# Patient Record
Sex: Female | Born: 1990 | Race: White | Hispanic: No | Marital: Married | Smoking: Former smoker
Health system: Southern US, Community
[De-identification: ages and names within clinical notes are randomized; demographics above are authoritative.]

## PROBLEM LIST (undated history)

## (undated) DIAGNOSIS — Z803 Family history of malignant neoplasm of breast: Secondary | ICD-10-CM

## (undated) DIAGNOSIS — F32A Depression, unspecified: Secondary | ICD-10-CM

## (undated) DIAGNOSIS — F419 Anxiety disorder, unspecified: Secondary | ICD-10-CM

## (undated) HISTORY — PX: GALLBLADDER SURGERY: SHX652

## (undated) HISTORY — PX: CHOLECYSTECTOMY: SHX55

## (undated) HISTORY — DX: Depression, unspecified: F32.A

## (undated) HISTORY — DX: Family history of malignant neoplasm of breast: Z80.3

---
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
## 2011-02-06 ENCOUNTER — Observation Stay: Payer: Self-pay | Admitting: Advanced Practice Midwife

## 2011-05-26 ENCOUNTER — Observation Stay: Payer: Self-pay | Admitting: Obstetrics and Gynecology

## 2011-05-31 ENCOUNTER — Observation Stay: Payer: Self-pay

## 2011-06-12 ENCOUNTER — Inpatient Hospital Stay: Payer: Self-pay | Admitting: Obstetrics and Gynecology

## 2011-06-12 LAB — CBC WITH DIFFERENTIAL/PLATELET
Basophil %: 0.3 %
Eosinophil #: 0.1 10*3/uL (ref 0.0–0.7)
HCT: 32.6 % — ABNORMAL LOW (ref 35.0–47.0)
HGB: 9.9 g/dL — ABNORMAL LOW (ref 12.0–16.0)
Lymphocyte #: 1.6 10*3/uL (ref 1.0–3.6)
MCH: 27.3 pg (ref 26.0–34.0)
MCHC: 30.3 g/dL — ABNORMAL LOW (ref 32.0–36.0)
Monocyte #: 0.4 10*3/uL (ref 0.0–0.7)
Neutrophil %: 75.3 %
Platelet: 198 10*3/uL (ref 150–440)

## 2011-06-13 LAB — HEMATOCRIT: HCT: 27.7 % — ABNORMAL LOW (ref 35.0–47.0)

## 2011-07-21 ENCOUNTER — Ambulatory Visit: Payer: Self-pay

## 2011-07-21 LAB — RAPID STREP-A WITH REFLX: Micro Text Report: POSITIVE

## 2011-10-25 ENCOUNTER — Emergency Department: Payer: Self-pay | Admitting: Emergency Medicine

## 2014-12-03 ENCOUNTER — Encounter: Payer: Self-pay | Admitting: Medical Oncology

## 2014-12-03 ENCOUNTER — Emergency Department
Admission: EM | Admit: 2014-12-03 | Discharge: 2014-12-03 | Disposition: A | Discharge status: Home / Self Care | Payer: Self-pay | Attending: Emergency Medicine | Admitting: Emergency Medicine

## 2014-12-03 DIAGNOSIS — J029 Acute pharyngitis, unspecified: Secondary | ICD-10-CM | POA: Insufficient documentation

## 2014-12-03 DIAGNOSIS — Z880 Allergy status to penicillin: Secondary | ICD-10-CM | POA: Insufficient documentation

## 2014-12-03 DIAGNOSIS — Z720 Tobacco use: Secondary | ICD-10-CM | POA: Insufficient documentation

## 2014-12-03 MED ORDER — IBUPROFEN 800 MG PO TABS: 800.0000 mg | ORAL_TABLET | Freq: Three times a day (TID) | ORAL | Status: DC | PRN

## 2014-12-03 MED ORDER — LIDOCAINE VISCOUS 2 % MT SOLN: 20.0000 mL | OROMUCOSAL | Status: DC | PRN

## 2014-12-03 MED ORDER — AZITHROMYCIN 250 MG PO TABS: ORAL_TABLET | ORAL | Status: DC

## 2014-12-03 NOTE — ED Provider Notes (Signed)
[facility] Emergency Department Provider Note  ____________________________________________  Time seen: Approximately 10:44 AM  I have reviewed the triage vital signs and the nursing notes.   HISTORY  Chief Complaint Sore Throat    HPI Diane Myers is a 24 y.o. female who presents to the emergency room for sudden onset of fever this morning along with a sore throat. Patient reports that this is the fourth on this patient had positive strep. Is referred to ENT. Denies any change of appetite. No nausea no vomiting or diarrhea.   History reviewed. No pertinent past medical history.  There are no active problems to display for this patient.   Past Surgical History  Procedure Laterality Date  . Cholecystectomy      Current Outpatient Rx  Name  Route  Sig  Dispense  Refill  . azithromycin (ZITHROMAX Z-PAK) 250 MG tablet      Take 2 tablets (500 mg) on  Day 1,  followed by 1 tablet (250 mg) once daily on Days 2 through 5.   6 each   0   . ibuprofen (ADVIL,MOTRIN) 800 MG tablet   Oral   Take 1 tablet (800 mg total) by mouth every 8 (eight) hours as needed.   30 tablet   0   . lidocaine (XYLOCAINE) 2 % solution   Mouth/Throat   Use as directed 20 mLs in the mouth or throat as needed for mouth pain.   100 mL   0     Allergies Amoxicillin  No family history on file.  Social History History  Substance Use Topics  . Smoking status: Current Every Day Smoker -- 0.50 packs/day  . Smokeless tobacco: Not on file  . Alcohol Use: No    Review of Systems Constitutional: No fever/chills Eyes: No visual changes. ENT: Positive sore throat Cardiovascular: Denies chest pain. Respiratory: Denies shortness of breath. Gastrointestinal: No abdominal pain.  No nausea, no vomiting.  No diarrhea.  No constipation. Genitourinary: Negative for dysuria. Musculoskeletal: Negative for back pain. Skin: Negative for rash. Neurological: Negative for  headaches, focal weakness or numbness.  10-point ROS otherwise negative.  ____________________________________________   PHYSICAL EXAM:  VITAL SIGNS: ED Triage Vitals  Enc Vitals Group     BP 12/03/14 1000 125/78 mmHg     Pulse Rate 12/03/14 1000 94     Resp 12/03/14 1000 17     Temp 12/03/14 1000 98 F (36.7 C)     Temp Source 12/03/14 1000 Oral     SpO2 12/03/14 1000 100 %     Weight 12/03/14 1000 230 lb (104.327 kg)     Height 12/03/14 1000  (1.6 m)     Head Cir --      Peak Flow --      Pain Score 12/03/14 1000 2     Pain Loc --      Pain Edu? --      Excl. in GC? --     Constitutional: Alert and oriented. Well appearing and in no acute distress. Eyes: Conjunctivae are normal. PERRL. EOMI. Head: Atraumatic. Nose: No congestion/rhinnorhea. Mouth/Throat: Mucous membranes are moist.  Oropharynx erythematous with tonsillar exudate noted. Neck: No stridor.  No lymphadenopathy Cardiovascular: Normal rate, regular rhythm. Grossly normal heart sounds.  Good peripheral circulation. Respiratory: Normal respiratory effort.  No retractions. Lungs CTAB. Gastrointestinal: Soft and nontender. No distention. No abdominal bruits. No CVA tenderness. Musculoskeletal: No lower extremity tenderness nor edema.  No joint effusions. Neurologic:  Normal speech and language. No gross focal neurologic deficits are appreciated. Speech is normal. No gait instability. Skin:  Skin is warm, dry and intact. No rash noted. Psychiatric: Mood and affect are normal. Speech and behavior are normal.  ____________________________________________   LABS (all labs ordered are listed, but only abnormal results are displayed)  Labs Reviewed  CULTURE, GROUP A STREP ([facility] ONLY)   ____________________________________________   PROCEDURES  Procedure(s) performed: None  Critical Care performed: No  ____________________________________________   INITIAL IMPRESSION / ASSESSMENT AND PLAN / ED  COURSE  Pertinent labs & imaging results that were available during my care of the patient were reviewed by me and considered in my medical decision making (see chart for details).  Acute pharyngitis. Rapid strep negative throat culture pending. Will prophylactically treat with Zithromax since patient has a penicillin allergy. Patient cursed follow up with ENT as directed.  Patient voices no other emergency medical complaints at this visit. ____________________________________________   FINAL CLINICAL IMPRESSION(S) / ED DIAGNOSES  Final diagnoses:  Acute pharyngitis, unspecified pharyngitis type      Evangeline Dakin, PA-C 12/03/14 1049  Jene Every, MD 12/03/14 [phone]

## 2014-12-03 NOTE — ED Notes (Signed)
Pt ambulatory to triage with reports of sore throat that began this am.

## 2014-12-03 NOTE — Discharge Instructions (Signed)

## 2014-12-05 LAB — CULTURE, GROUP A STREP (THRC)

## 2014-12-07 LAB — POCT RAPID STREP A: STREPTOCOCCUS, GROUP A SCREEN (DIRECT): POSITIVE — AB

## 2016-11-10 ENCOUNTER — Emergency Department
Admission: EM | Admit: 2016-11-10 | Discharge: 2016-11-10 | Disposition: A | Discharge status: Home / Self Care | Payer: Self-pay | Attending: Emergency Medicine | Admitting: Emergency Medicine

## 2016-11-10 ENCOUNTER — Encounter: Payer: Self-pay | Admitting: Medical Oncology

## 2016-11-10 DIAGNOSIS — K0889 Other specified disorders of teeth and supporting structures: Secondary | ICD-10-CM | POA: Insufficient documentation

## 2016-11-10 DIAGNOSIS — Z791 Long term (current) use of non-steroidal anti-inflammatories (NSAID): Secondary | ICD-10-CM | POA: Insufficient documentation

## 2016-11-10 DIAGNOSIS — Z79899 Other long term (current) drug therapy: Secondary | ICD-10-CM | POA: Insufficient documentation

## 2016-11-10 DIAGNOSIS — Z9049 Acquired absence of other specified parts of digestive tract: Secondary | ICD-10-CM | POA: Insufficient documentation

## 2016-11-10 DIAGNOSIS — F17200 Nicotine dependence, unspecified, uncomplicated: Secondary | ICD-10-CM | POA: Insufficient documentation

## 2016-11-10 MED ORDER — TRAMADOL HCL 50 MG PO TABS: 50.0000 mg | ORAL_TABLET | Freq: Two times a day (BID) | ORAL | 0 refills | Status: DC | PRN

## 2016-11-10 MED ORDER — IBUPROFEN 600 MG PO TABS
600.0000 mg | ORAL_TABLET | Freq: Once | ORAL | Status: AC
  Administered 2016-11-10: 600 mg via ORAL
  Filled 2016-11-10: qty 1

## 2016-11-10 MED ORDER — ERYTHROMYCIN BASE 500 MG PO TABS: 500.0000 mg | ORAL_TABLET | Freq: Four times a day (QID) | ORAL | 0 refills | Status: AC

## 2016-11-10 MED ORDER — IBUPROFEN 600 MG PO TABS: 600.0000 mg | ORAL_TABLET | Freq: Three times a day (TID) | ORAL | 0 refills | Status: DC | PRN

## 2016-11-10 MED ORDER — TRAMADOL HCL 50 MG PO TABS
50.0000 mg | ORAL_TABLET | Freq: Once | ORAL | Status: AC
  Administered 2016-11-10: 50 mg via ORAL
  Filled 2016-11-10: qty 1

## 2016-11-10 NOTE — ED Notes (Signed)
Pt verbalized understanding of discharge instructions. NAD at this time. 

## 2016-11-10 NOTE — ED Triage Notes (Signed)
Left sided dental pain with swelling x 2 days.

## 2016-11-10 NOTE — Discharge Instructions (Signed)
May follow up with list of dental clinics provided: OPTIONS FOR DENTAL FOLLOW UP CARE  [place] Department of Health and Human Services - Local Safety Net Dental Clinics TripDoors.comhttp://www.ncdhhs.gov/dph/oralhealth/services/safetynetclinics.htm   [facility] [phone])  [facility] [place] [phone])  [facility] [place] [phone]  [facility] [phone])  [facility] [phone]) This clinic caters to the indigent population and is on a lottery system. Location: [employer], [address], [address], [place] Clinic Hours: Wednesdays from 6pm - 9pm, patients seen by a lottery system. For dates, call or go to [email or web address] Services: Cleanings, fillings and simple extractions. Payment Options: DENTAL WORK IS FREE OF CHARGE. Bring proof of income or support. Best way to get seen: Arrive at 5:15 pm - this is a lottery, NOT first come/first serve, so arriving earlier will not increase your chances of being seen.     [facility] [phone] Select option 1 for emergencies   Location: [facility], [place], [address], [place] [facility] Hours: No walk-ins accepted - call the day before to schedule an appointment. Check in times are 9:30 am and 1:30 pm. Services: Simple extractions, temporary fillings, pulpectomy/pulp debridement, uncomplicated abscess drainage. Payment Options: PAYMENT IS DUE AT THE TIME OF SERVICE.  Fee is usually $100-200, additional surgical procedures (e.g. abscess drainage) may be extra. Cash, checks, Visa/MasterCard accepted.  Can file Medicaid if patient is covered for dental - patient should call case worker to check. No discount for [facility] patients. Best way to get seen: MUST call the day before and get onto the schedule. Can usually be seen the next 1-2 days. No walk-ins accepted.      [facility] [phone]   Location: [facility], [address], [place] Clinic Hours: M, W, Th, F 8am or 1:30pm, Tues 9a or 1:30 - first come/first served. Services: Simple extractions, temporary fillings, uncomplicated abscess drainage.  You do not need to be an [facility] [place] [place] resident. Payment Options: PAYMENT IS DUE AT THE TIME OF SERVICE. Dental insurance, otherwise sliding scale - bring proof of income or support. Depending on income and treatment needed, cost is usually $50-200. Best way to get seen: Arrive early as it is first come/first served.     [facility] [phone]   Location: [address] Clinic Hours: Mon-Thu 8a-5p Services: Most basic dental services including extractions and fillings. Payment Options: PAYMENT IS DUE AT THE TIME OF SERVICE. Sliding scale, up to 50% off - bring proof if income or support. Medicaid with dental option accepted. Best way to get seen: Call to schedule an appointment, can usually be seen within 2 weeks OR they will try to see walk-ins - show up at 8a or 2p (you may have to wait).     [facility] [phone] [place] RESIDENTS ONLY   Location: [facility], [address], [place], [place] [phone] Clinic Hours: By appointment only. Monday - Thursday 8am-5pm, Friday 8am-12pm Services: Cleanings, fillings, extractions. Payment Options: PAYMENT IS DUE AT THE TIME OF SERVICE. Cash, Visa or MasterCard. Sliding scale - $30 minimum per service. Best way to get seen: Come in to office, complete packet and make an appointment - need proof of income or support monies for each household member and proof of [facility] [place] residence. Usually takes about a month to get in.     [facility] [phone]   Location: [address], [place] Emergency Room Hours: Walk-in Urgent Care  Dental Services are offered Monday-Friday mornings only. The numbers of emergencies accepted daily is limited to the number of providers available. Maximum 15 - Mondays, Wednesdays & Thursdays Maximum 10 - Tuesdays & Fridays Services: You do not need to be a [facility] resident to be seen for a dental emergency. Emergencies are defined as pain, swelling, abnormal bleeding, or dental trauma. Walkins will receive x-rays if needed. NOTE: Dental cleaning is not an emergency. Payment Options: PAYMENT IS DUE AT THE TIME OF SERVICE. Minimum co-pay is $40.00 for uninsured patients. Minimum co-pay is $3.00 for Medicaid with dental coverage. Dental Insurance is accepted and must be presented at time of visit. Medicare does not cover dental. Forms of payment: Cash, credit card, checks. Best way to get seen: If not previously registered with the clinic, walk-in dental registration begins at 7:15 am and is on a first come/first serve basis. If previously registered with the clinic, call to make an appointment.     [facility] [place] ONLY   Location: [address], [place], [place] Clinic Hours: Mon-Thu 10a-2p Services: Extractions only! Payment Options: FREE (donations accepted) - bring proof of income or support Best way to get seen: Call and schedule an appointment OR come at 8am on the 1st Monday of every month (except for holidays) when it is first come/first served.     [facility] [phone]   Location: [place], [place] Clinic Hours: Friday mornings Services, Payment Options, Best way to get seen: Call for info

## 2016-11-10 NOTE — ED Provider Notes (Signed)
[facility] Emergency Department Provider Note   ____________________________________________   First MD Initiated Contact with Patient 11/10/16 1358     (approximate)  I have reviewed the triage vital signs and the nursing notes.   HISTORY  Chief Complaint Dental Pain    HPI Diane Myers is a 26 y.o. female patient complains of dental pain for 2 days. Patient states she has a cavity is filled with some over-the-counter filler which has not relieved her pain.patient rates the pain as a 5/10. Patient described a pain as "achy, stabbing'.   History reviewed. No pertinent past medical history.  There are no active problems to display for this patient.   Past Surgical History:  Procedure Laterality Date  . CHOLECYSTECTOMY      Prior to Admission medications   Medication Sig Start Date End Date Taking? Authorizing Provider  azithromycin (ZITHROMAX Z-PAK) 250 MG tablet Take 2 tablets (500 mg) on  Day 1,  followed by 1 tablet (250 mg) once daily on Days 2 through 5. 12/03/14   Beers, Charmayne Sheer, PA-C  erythromycin base (E-MYCIN) 500 MG tablet Take 1 tablet (500 mg total) by mouth 4 (four) times daily. 11/10/16 11/20/16  Joni Reining, PA-C  ibuprofen (ADVIL,MOTRIN) 600 MG tablet Take 1 tablet (600 mg total) by mouth every 8 (eight) hours as needed. 11/10/16   Joni Reining, PA-C  ibuprofen (ADVIL,MOTRIN) 800 MG tablet Take 1 tablet (800 mg total) by mouth every 8 (eight) hours as needed. 12/03/14   Beers, Charmayne Sheer, PA-C  lidocaine (XYLOCAINE) 2 % solution Use as directed 20 mLs in the mouth or throat as needed for mouth pain. 12/03/14   Beers, Charmayne Sheer, PA-C  traMADol (ULTRAM) 50 MG tablet Take 1 tablet (50 mg total) by mouth every 12 (twelve) hours as needed. 11/10/16   Joni Reining, PA-C    Allergies Amoxicillin  No family history on file.  Social History Social History  Substance Use Topics  . Smoking status: Current Every Day Smoker   Packs/day: 0.50  . Smokeless tobacco: Not on file  . Alcohol use No    Review of Systems  Constitutional: No fever/chills Eyes: No visual changes. ENT: No sore throat. Dental pain Cardiovascular: Denies chest pain. Respiratory: Denies shortness of breath. Gastrointestinal: No abdominal pain.  No nausea, no vomiting.  No diarrhea.  No constipation. Genitourinary: Negative for dysuria. Musculoskeletal: Negative for back pain. Skin: Negative for rash. Neurological: Negative for headaches, focal weakness or numbness. Allergic/Immunilogical: amoxicillin  ____________________________________________   PHYSICAL EXAM:  VITAL SIGNS: ED Triage Vitals  Enc Vitals Group     BP 11/10/16 1330 (!) 141/80     Pulse Rate 11/10/16 1328 94     Resp 11/10/16 1328 18     Temp 11/10/16 1328 98.6 F (37 C)     Temp Source 11/10/16 1328 Oral     SpO2 11/10/16 1328 99 %     Weight 11/10/16 1328 230 lb (104.3 kg)     Height --      Head Circumference --      Peak Flow --      Pain Score 11/10/16 1328 5     Pain Loc --      Pain Edu? --      Excl. in GC? --     Constitutional: Alert and oriented. Well appearing and in no acute distress. Mouth/Throat: Mucous membranes are moist.  Oropharynx non-erythematous. Edematous gingiva and cavity at tooth  number 19. Hematological/Lymphatic/Immunilogical: No cervical lymphadenopathy. Cardiovascular: Normal rate, regular rhythm. Grossly normal heart sounds.  Good peripheral circulation. Respiratory: Normal respiratory effort.  No retractions. Lungs CTAB. Neurologic:  Normal speech and language. No gross focal neurologic deficits are appreciated. No gait instability. Skin:  Skin is warm, dry and intact. No rash noted. Psychiatric: Mood and affect are normal. Speech and behavior are normal.  ____________________________________________   LABS (all labs ordered are listed, but only abnormal results are displayed)  Labs Reviewed - No data to  display ____________________________________________  EKG   ____________________________________________  RADIOLOGY   ____________________________________________   PROCEDURES  Procedure(s) performed: None  Procedures  Critical Care performed: No  ____________________________________________   INITIAL IMPRESSION / ASSESSMENT AND PLAN / ED COURSE  Pertinent labs & imaging results that were available during my care of the patient were reviewed by me and considered in my medical decision making (see chart for details).  Dental pain secondary to cavity. Patient given discharge care instructions. Patient advised to follow-up with the walk-in dental clinic on Monday morning.      ____________________________________________   FINAL CLINICAL IMPRESSION(S) / ED DIAGNOSES  Final diagnoses:  Pain, dental      NEW MEDICATIONS STARTED DURING THIS VISIT:  New Prescriptions   ERYTHROMYCIN BASE (E-MYCIN) 500 MG TABLET    Take 1 tablet (500 mg total) by mouth 4 (four) times daily.   IBUPROFEN (ADVIL,MOTRIN) 600 MG TABLET    Take 1 tablet (600 mg total) by mouth every 8 (eight) hours as needed.   TRAMADOL (ULTRAM) 50 MG TABLET    Take 1 tablet (50 mg total) by mouth every 12 (twelve) hours as needed.     Note:  This document was prepared using Dragon voice recognition software and may include unintentional dictation errors.    Joni ReiningSmith, Anjoli Diemer K, PA-C 11/10/16 1411    Jene EveryKinner, Robert, MD 11/10/16 [phone]

## 2016-11-15 ENCOUNTER — Encounter: Payer: Self-pay | Admitting: *Deleted

## 2016-11-15 ENCOUNTER — Emergency Department
Admission: EM | Admit: 2016-11-15 | Discharge: 2016-11-15 | Disposition: A | Discharge status: Home / Self Care | Payer: Self-pay | Attending: Emergency Medicine | Admitting: Emergency Medicine

## 2016-11-15 DIAGNOSIS — Z9049 Acquired absence of other specified parts of digestive tract: Secondary | ICD-10-CM | POA: Insufficient documentation

## 2016-11-15 DIAGNOSIS — Z791 Long term (current) use of non-steroidal anti-inflammatories (NSAID): Secondary | ICD-10-CM | POA: Insufficient documentation

## 2016-11-15 DIAGNOSIS — R21 Rash and other nonspecific skin eruption: Secondary | ICD-10-CM | POA: Insufficient documentation

## 2016-11-15 DIAGNOSIS — F17200 Nicotine dependence, unspecified, uncomplicated: Secondary | ICD-10-CM | POA: Insufficient documentation

## 2016-11-15 DIAGNOSIS — Z79899 Other long term (current) drug therapy: Secondary | ICD-10-CM | POA: Insufficient documentation

## 2016-11-15 DIAGNOSIS — T368X5A Adverse effect of other systemic antibiotics, initial encounter: Secondary | ICD-10-CM | POA: Insufficient documentation

## 2016-11-15 DIAGNOSIS — T7840XA Allergy, unspecified, initial encounter: Secondary | ICD-10-CM

## 2016-11-15 NOTE — ED Provider Notes (Signed)
[facility]-EMERGENCY DEPARTMENT Provider Note   CSN: [id number] Arrival date & time: 11/15/16  1544     History   Chief Complaint No chief complaint on file.   HPI Diane Myers is a 26 y.o. female Presents to the emergency department for evaluation of skin rash. Patient was seen in the emergency department Saturday 5 days ago, started on clindamycin for dental pain. Pain has resolved. States she works outside has been out of his son has developed a rash along her head, neck and back as well as her feet. She denies any chest pain, shortness of breath, skin peeling, conjunctivitis symptoms, coughing, wheezing, Itching.   HPI  History reviewed. No pertinent past medical history.  There are no active problems to display for this patient.   Past Surgical History:  Procedure Laterality Date  . CHOLECYSTECTOMY      OB History    No data available       Home Medications    Prior to Admission medications   Medication Sig Start Date End Date Taking? Authorizing Provider  azithromycin (ZITHROMAX Z-PAK) 250 MG tablet Take 2 tablets (500 mg) on  Day 1,  followed by 1 tablet (250 mg) once daily on Days 2 through 5. 12/03/14   Beers, Charmayne Sheer, PA-C  erythromycin base (E-MYCIN) 500 MG tablet Take 1 tablet (500 mg total) by mouth 4 (four) times daily. 11/10/16 11/20/16  Joni Reining, PA-C  ibuprofen (ADVIL,MOTRIN) 600 MG tablet Take 1 tablet (600 mg total) by mouth every 8 (eight) hours as needed. 11/10/16   Joni Reining, PA-C  ibuprofen (ADVIL,MOTRIN) 800 MG tablet Take 1 tablet (800 mg total) by mouth every 8 (eight) hours as needed. 12/03/14   Beers, Charmayne Sheer, PA-C  lidocaine (XYLOCAINE) 2 % solution Use as directed 20 mLs in the mouth or throat as needed for mouth pain. 12/03/14   Beers, Charmayne Sheer, PA-C  traMADol (ULTRAM) 50 MG tablet Take 1 tablet (50 mg total) by mouth every 12 (twelve) hours as needed. 11/10/16   Joni Reining, PA-C    Family History History reviewed. No pertinent  family history.  Social History Social History  Substance Use Topics  . Smoking status: Current Every Day Smoker    Packs/day: 0.50  . Smokeless tobacco: Never Used  . Alcohol use No     Allergies   Amoxicillin   Review of Systems Review of Systems  Constitutional: Negative for activity change, chills, fatigue and fever.  HENT: Negative for congestion, sinus pressure, sore throat, trouble swallowing and voice change.   Eyes: Negative for visual disturbance.  Respiratory: Negative for cough, chest tightness, shortness of breath, wheezing and stridor.   Cardiovascular: Negative for chest pain and leg swelling.  Gastrointestinal: Negative for abdominal pain, diarrhea, nausea and vomiting.  Genitourinary: Negative for dysuria.  Musculoskeletal: Negative for arthralgias and gait problem.  Skin: Positive for rash.  Neurological: Negative for weakness, numbness and headaches.  Hematological: Negative for adenopathy.  Psychiatric/Behavioral: Negative for agitation, behavioral problems and confusion.     Physical Exam Updated Vital Signs BP 140/84 (BP Location: Left Arm)   Pulse 100   Temp 98.4 F (36.9 C) (Oral)   Resp 16   Ht 5\' 4"  (1.626 m)   Wt 99.8 kg (220 lb)   SpO2 99%   BMI 37.76 kg/m   Physical Exam  Constitutional: She is oriented to person, place, and time. She appears well-developed and well-nourished. No distress.  HENT:  Head: Normocephalic  and atraumatic.  Right Ear: External ear normal.  Left Ear: External ear normal.  Nose: Nose normal.  Mouth/Throat: Oropharynx is clear and moist. No oropharyngeal exudate.  No signs of angioedema.  Eyes: EOM are normal. Pupils are equal, round, and reactive to light. Right eye exhibits no discharge. Left eye exhibits no discharge.  Neck: Normal range of motion. Neck supple.  Cardiovascular: Normal rate, regular rhythm and intact distal pulses.   Pulmonary/Chest: Effort normal and breath sounds normal. No respiratory  distress. She exhibits no tenderness.  Abdominal: Soft. She exhibits no distension. There is no tenderness.  Musculoskeletal: Normal range of motion. She exhibits no edema.  Neurological: She is alert and oriented to person, place, and time. She has normal reflexes.  Skin: Skin is warm and dry. Rash (macular lacelike rash along the back, neck, feet. No evidence of blistering or skin peeling.) noted.  Skin is nontender.  Psychiatric: She has a normal mood and affect. Her behavior is normal. Thought content normal.     ED Treatments / Results  Labs (all labs ordered are listed, but only abnormal results are displayed) Labs Reviewed - No data to display  EKG  EKG Interpretation None       Radiology No results found.  Procedures Procedures (including critical care time)  Medications Ordered in ED Medications - No data to display   Initial Impression / Assessment and Plan / ED Course  I have reviewed the triage vital signs and the nursing notes.  Pertinent labs & imaging results that were available during my care of the patient were reviewed by me and considered in my medical decision making (see chart for details).     26 year old female with allergic reaction to clindamycin. She has been out in the sun, could be some photosensitivity. Recommend she discontinue the medication. No sign of blistering, skin peeling, conjunctivitis, shortness of breath. Vital signs are stable. She will discontinue the medication for her dental pain, dental pain has resolved, she will follow-up with dentist. She is educated on signs and symptoms return to clinic for.  Final Clinical Impressions(s) / ED Diagnoses   Final diagnoses:  Allergic reaction to drug, initial encounter  Skin rash    New Prescriptions New Prescriptions   No medications on file     Ronnette JuniperGaines, Thomas C, PA-C 11/15/16 1629    Merrily Brittleifenbark, Neil, MD 11/15/16 [phone]

## 2016-11-15 NOTE — ED Triage Notes (Signed)
Pt to ED reporting a rash after starting clindamycin on Tuesday. Pt was placed on antibiotic by ED for tooth infection. Tooth has cleared up per pt but pt now has rash covering chest and arms. Rash consists of small red raised bumps that pt reports feel as though they are burning and are itching at this time. PT reports having taken benadryl for the first time today and denies relief. No SOB or complications with airway noted.

## 2016-11-15 NOTE — ED Notes (Addendum)
See triage note  Was seen about 5  days ago for dental pain and placed on e-mycin  Developed a rash  No resp distress noted

## 2016-11-15 NOTE — Discharge Instructions (Signed)
Please discontinue clindamycin and stay out of the sun. Please follow-up with dental clinic as soon as possible. Return to the ER for any worsening symptoms urgent changes in her health. Would recommend skin testing to confirm allergy to clindamycin.

## 2018-05-18 ENCOUNTER — Other Ambulatory Visit: Payer: Self-pay

## 2018-05-18 ENCOUNTER — Encounter: Payer: Self-pay | Admitting: Emergency Medicine

## 2018-05-18 ENCOUNTER — Emergency Department
Admission: EM | Admit: 2018-05-18 | Discharge: 2018-05-18 | Disposition: A | Discharge status: Home / Self Care | Payer: Self-pay | Attending: Emergency Medicine | Admitting: Emergency Medicine

## 2018-05-18 ENCOUNTER — Emergency Department: Payer: Self-pay

## 2018-05-18 DIAGNOSIS — F17210 Nicotine dependence, cigarettes, uncomplicated: Secondary | ICD-10-CM | POA: Insufficient documentation

## 2018-05-18 DIAGNOSIS — J209 Acute bronchitis, unspecified: Secondary | ICD-10-CM | POA: Insufficient documentation

## 2018-05-18 MED ORDER — ALBUTEROL SULFATE HFA 108 (90 BASE) MCG/ACT IN AERS: 2.0000 | INHALATION_SPRAY | RESPIRATORY_TRACT | 1 refills | Status: DC | PRN

## 2018-05-18 MED ORDER — PREDNISONE 10 MG PO TABS: 50.0000 mg | ORAL_TABLET | Freq: Every day | ORAL | 0 refills | Status: DC

## 2018-05-18 MED ORDER — PREDNISONE 20 MG PO TABS
60.0000 mg | ORAL_TABLET | Freq: Once | ORAL | Status: AC
  Administered 2018-05-18: 60 mg via ORAL
  Filled 2018-05-18: qty 3

## 2018-05-18 MED ORDER — AZITHROMYCIN 250 MG PO TABS: ORAL_TABLET | ORAL | 0 refills | Status: DC

## 2018-05-18 MED ORDER — IPRATROPIUM-ALBUTEROL 0.5-2.5 (3) MG/3ML IN SOLN
3.0000 mL | Freq: Once | RESPIRATORY_TRACT | Status: AC
  Administered 2018-05-18: 3 mL via RESPIRATORY_TRACT
  Filled 2018-05-18: qty 3

## 2018-05-18 NOTE — Discharge Instructions (Signed)
Follow up with the primary care provider of your choice for symptoms that are not improving over the next few days.  Return to the ER for symptoms that change or worsen if unable to schedule an appointment. 

## 2018-05-18 NOTE — ED Provider Notes (Signed)
[facility] Emergency Department Provider Note  ____________________________________________  Time seen: Approximately 10:55 AM  I have reviewed the triage vital signs and the nursing notes.   HISTORY  Chief Complaint Cough   HPI Diane Myers is a 27 y.o. female who presents to the emergency department for treatment and evaluation of cough for the past several weeks.  It is occasionally productive. She has been taking Mucinex with some relief. Fever last week. She smokes about 1/2 pack per day.    History reviewed. No pertinent past medical history.  There are no active problems to display for this patient.   Past Surgical History:  Procedure Laterality Date  . CHOLECYSTECTOMY      Prior to Admission medications   Medication Sig Start Date End Date Taking? Authorizing Provider  albuterol (PROVENTIL HFA;VENTOLIN HFA) 108 (90 Base) MCG/ACT inhaler Inhale 2 puffs into the lungs every 4 (four) hours as needed for wheezing or shortness of breath. 05/18/18   Micaiah Remillard, Rulon Eisenmengerari B, FNP  azithromycin (ZITHROMAX) 250 MG tablet 2 tablets today, then 1 tablet for the next 4 days. 05/18/18   Saifan Rayford, Rulon Eisenmengerari B, FNP  ibuprofen (ADVIL,MOTRIN) 600 MG tablet Take 1 tablet (600 mg total) by mouth every 8 (eight) hours as needed. 11/10/16   Joni ReiningSmith, Ronald K, PA-C  ibuprofen (ADVIL,MOTRIN) 800 MG tablet Take 1 tablet (800 mg total) by mouth every 8 (eight) hours as needed. 12/03/14   Beers, Charmayne Sheerharles M, PA-C  lidocaine (XYLOCAINE) 2 % solution Use as directed 20 mLs in the mouth or throat as needed for mouth pain. 12/03/14   Beers, Charmayne Sheerharles M, PA-C  predniSONE (DELTASONE) 10 MG tablet Take 5 tablets (50 mg total) by mouth daily. 05/18/18   Jocelyn Nold, Rulon Eisenmengerari B, FNP  traMADol (ULTRAM) 50 MG tablet Take 1 tablet (50 mg total) by mouth every 12 (twelve) hours as needed. 11/10/16   Joni ReiningSmith, Ronald K, PA-C    Allergies Amoxicillin  No family history on file.  Social History Social History    Tobacco Use  . Smoking status: Current Every Day Smoker    Packs/day: 0.50  . Smokeless tobacco: Never Used  Substance Use Topics  . Alcohol use: Yes  . Drug use: No    Review of Systems Constitutional: No fever/chills. No appetite. ENT: Positive for sore throat. Cardiovascular: Denies chest pain. Respiratory: Positive for shortness of breath. Positive for cough. Positive for wheezing.  Gastrointestinal: Positive for nausea,  Negative for vomiting.  no diarrhea.  Musculoskeletal: Negative for body aches Skin: Negative for rash. Neurological: Negative for headaches ____________________________________________   PHYSICAL EXAM:  VITAL SIGNS: ED Triage Vitals  Enc Vitals Group     BP 05/18/18 1018 137/74     Pulse Rate 05/18/18 1018 91     Resp 05/18/18 1018 16     Temp 05/18/18 1018 98.6 F (37 C)     Temp Source 05/18/18 1018 Oral     SpO2 05/18/18 1018 98 %     Weight 05/18/18 1019 230 lb (104.3 kg)     Height 05/18/18 1019 5\' 3"  (1.6 m)     Head Circumference --      Peak Flow --      Pain Score 05/18/18 1019 3     Pain Loc --      Pain Edu? --      Excl. in GC? --     Constitutional: Alert and oriented. Well appearing and in no acute distress. Eyes: Conjunctivae are normal.  Ears: Bilateral TM normal. Nose: Maxillary sinus congestion noted; no rhinnorhea. Mouth/Throat: Mucous membranes are moist.  Oropharynx without exudate. Tonsils not visualized. Uvula midline. Neck: No stridor.  Lymphatic: No cervical lymphadenopathy. Cardiovascular: Normal rate, regular rhythm. Good peripheral circulation. Respiratory: Respirations are even and unlabored.  No retractions. Rhonchi and wheezes throughout. Gastrointestinal: Soft and nontender.  Musculoskeletal: FROM x 4 extremities.  Neurologic:  Normal speech and language. Skin:  Skin is warm, dry and intact. No rash noted. Psychiatric: Mood and affect are normal. Speech and behavior are  normal.  ____________________________________________   LABS (all labs ordered are listed, but only abnormal results are displayed)  Labs Reviewed - No data to display ____________________________________________  EKG  Not indicated. ____________________________________________  RADIOLOGY  Chest x-ray negative for any concerns of pneumonia. ____________________________________________   PROCEDURES  Procedure(s) performed: None  Critical Care performed: No ____________________________________________   INITIAL IMPRESSION / ASSESSMENT AND PLAN / ED COURSE  27 y.o. female who presents to the emergency department for treatment and evaluation of cough x several weeks. Course breath sounds. DuoNeb ordered as well as prednisone and chest x-ray.   After DuoNeb, the wheezes and rhonchi had significantly diminished and there is much better air movement.  To be discharged home with azithromycin, prednisone, and albuterol.  She will continue to take Mucinex.  She is to follow-up with the primary care provider of her choice for symptoms are not improving over the next few days.  She is to return to the emergency department for symptoms that change or worsen if unable to schedule an appointment.   Medications  ipratropium-albuterol (DUONEB) 0.5-2.5 (3) MG/3ML nebulizer solution 3 mL (3 mLs Nebulization Given 05/18/18 1105)  predniSONE (DELTASONE) tablet 60 mg (60 mg Oral Given 05/18/18 1105)    ED Discharge Orders         Ordered    azithromycin (ZITHROMAX) 250 MG tablet     05/18/18 1203    predniSONE (DELTASONE) 10 MG tablet  Daily     05/18/18 1203    albuterol (PROVENTIL HFA;VENTOLIN HFA) 108 (90 Base) MCG/ACT inhaler  Every 4 hours PRN     05/18/18 1203           Pertinent labs & imaging results that were available during my care of the patient were reviewed by me and considered in my medical decision making (see chart for details).    If controlled substance  prescribed during this visit, 12 month history viewed on the [facility] prior to issuing an initial prescription for Schedule II or III opiod. ____________________________________________   FINAL CLINICAL IMPRESSION(S) / ED DIAGNOSES  Final diagnoses:  Acute bronchitis, unspecified organism    Note:  This document was prepared using Dragon voice recognition software and may include unintentional dictation errors.     Chinita Pester, FNP 05/18/18 1233    Governor Rooks, MD 05/18/18 [phone]

## 2018-05-18 NOTE — ED Triage Notes (Signed)
Pt to ED via POV c/o cough x several weeks. Pt states that she is able to get come mucus up but she is getting choked on it and it almost causes her to vomit. Pt states that she feels like it is hard to breath. Pt has equal and unlabored respirations in triage at this time.

## 2018-07-16 ENCOUNTER — Ambulatory Visit: Payer: Self-pay | Attending: Oncology

## 2018-07-16 ENCOUNTER — Encounter (INDEPENDENT_AMBULATORY_CARE_PROVIDER_SITE_OTHER): Payer: Self-pay

## 2018-07-16 ENCOUNTER — Ambulatory Visit
Admission: RE | Admit: 2018-07-16 | Discharge: 2018-07-16 | Disposition: A | Discharge status: Home / Self Care | Payer: Self-pay | Source: Ambulatory Visit | Attending: Oncology | Admitting: Oncology

## 2018-07-16 VITALS — BP 144/82 | HR 80 | Temp 98.1°F | Ht 63.25 in | Wt 247.0 lb

## 2018-07-16 DIAGNOSIS — Z0000 Encounter for general adult medical examination without abnormal findings: Secondary | ICD-10-CM | POA: Insufficient documentation

## 2018-07-16 NOTE — Progress Notes (Signed)
  Subjective:     Patient ID: Diane Myers, female   DOB: 12-12-1990, 28 y.o.   MRN: [id number]  HPI   Review of Systems     Objective:   Physical Exam Chest:     Breasts: Breasts are asymmetrical.        Right: No swelling, bleeding, inverted nipple, mass, nipple discharge, skin change or tenderness.        Left: No swelling, bleeding, inverted nipple, mass, nipple discharge, skin change or tenderness.     Comments: Right breast larger than left       Assessment:     28 year old patient patient presents for [facility] clinic visit.  Husband present for exam. Patient referred as a High Risk.  Her mother was diagnosed with breast cancer at age 60, and passed away at age 35.   Mother was treated here at [facility]. Unsure if genetic testing performed.  Patient screened, and meets [facility] eligibility.  Patient does not have insurance, Medicare or Medicaid.  Handout given on Affordable Care Act.  Instructed patient on breast self awareness using teach back method.  Clinical breast exam unremarkable.  No mass or lump palpated.    Plan:  Sent for bilateral screening mammogram.  Will discuss High Risk follow-up with Dr. Orlie Dakin.

## 2018-07-17 ENCOUNTER — Other Ambulatory Visit: Payer: Self-pay

## 2018-07-17 ENCOUNTER — Other Ambulatory Visit: Payer: Self-pay | Admitting: *Deleted

## 2018-07-17 DIAGNOSIS — R920 Mammographic microcalcification found on diagnostic imaging of breast: Secondary | ICD-10-CM

## 2018-07-17 NOTE — Progress Notes (Signed)
Opened in error

## 2018-07-25 ENCOUNTER — Ambulatory Visit
Admission: RE | Admit: 2018-07-25 | Discharge: 2018-07-25 | Disposition: A | Discharge status: Home / Self Care | Payer: Self-pay | Source: Ambulatory Visit | Attending: Oncology | Admitting: Oncology

## 2018-07-25 DIAGNOSIS — R920 Mammographic microcalcification found on diagnostic imaging of breast: Secondary | ICD-10-CM

## 2018-07-28 NOTE — Progress Notes (Signed)
Letter mailed from [facility] to notify of normal mammogram results.  Patient to return in one year for annual screening. Plan to schedule for High Risk visit with Dr. Orlie Dakin.  Copy to [facility].

## 2018-07-29 ENCOUNTER — Other Ambulatory Visit: Payer: Self-pay

## 2018-07-29 DIAGNOSIS — Z0000 Encounter for general adult medical examination without abnormal findings: Secondary | ICD-10-CM

## 2018-07-29 NOTE — Progress Notes (Signed)
28 year old female seen in [facility] as High Risk for breast cancer.  Diagnostic mammogram and ultrasound performed.  Birads 1 results.  Patient's mother was diagnosed with breast cancer at 41, and passed away at 29 years old in 67.  Referral to Dr. Orlie Dakin for High Risk recommendations on 08/05/2018 at 11:15. .  Patient is unsure if her mother had genetic testing, but does not think so.    Mailed appointment reminder.

## 2018-08-03 DIAGNOSIS — Z9189 Other specified personal risk factors, not elsewhere classified: Secondary | ICD-10-CM | POA: Insufficient documentation

## 2018-08-03 NOTE — Progress Notes (Signed)
[facility]  [phone] [phone]) [phone]  ID: Diane Myers OB: 12-07-1990  MR#: [id number]  NOM#:[id number]  Patient Care Team: Federico Flake, MD as PCP - General (Obstetrics and Gynecology) Jim Like, RN as Registered Nurse Scarlett Presto, RN as Registered Nurse  CHIEF COMPLAINT: High risk for breast cancer.  INTERVAL HISTORY: Patient is a 28 year old female who presents to clinic for evaluation of her risk of developing breast cancer.  Her mother developed breast cancer at age 21 and subsequently died at the age of 22.  Patient reports she was not genetically tested.  She has several great aunts with breast cancer, but no other malignancy in the family.  Her most recent mammogram was reported as BI-RADS 1.  She currently feels well and is asymptomatic.  She has no neurologic complaints.  She denies any recent fevers or illnesses.  She has a good appetite and denies weight loss.  She has no chest pain or shortness of breath.  She denies any nausea, vomiting, constipation, or diarrhea.  She has no urinary complaints.  Patient feels at her baseline offers no specific complaints today.  REVIEW OF SYSTEMS:   Review of Systems  Constitutional: Negative.  Negative for fever, malaise/fatigue and weight loss.  Respiratory: Negative.  Negative for cough, hemoptysis and shortness of breath.   Cardiovascular: Negative.  Negative for chest pain and leg swelling.  Gastrointestinal: Negative.  Negative for abdominal pain.  Genitourinary: Negative.  Negative for dysuria.  Musculoskeletal: Negative.  Negative for back pain.  Skin: Negative.  Negative for rash.  Neurological: Negative.  Negative for focal weakness, weakness and headaches.  Psychiatric/Behavioral: Negative.  The patient is not nervous/anxious.     As per HPI. Otherwise, a complete review of systems is negative.  PAST MEDICAL HISTORY: Past Medical History:  Diagnosis Date  . Family  history of breast cancer   . Family history of breast cancer     PAST SURGICAL HISTORY: Past Surgical History:  Procedure Laterality Date  . CHOLECYSTECTOMY      FAMILY HISTORY: Family History  Problem Relation Age of Onset  . Breast cancer Mother 35  . Breast cancer Maternal Aunt        2 maternal great aunts    ADVANCED DIRECTIVES (Y/N):  N  HEALTH MAINTENANCE: Social History   Tobacco Use  . Smoking status: Current Every Day Smoker    Packs/day: 0.50  . Smokeless tobacco: Never Used  Substance Use Topics  . Alcohol use: Yes  . Drug use: No     Colonoscopy:  PAP:  Bone density:  Lipid panel:  Allergies  Allergen Reactions  . Amoxicillin Rash  . Clindamycin Rash    Current Outpatient Medications  Medication Sig Dispense Refill  . albuterol (PROVENTIL HFA;VENTOLIN HFA) 108 (90 Base) MCG/ACT inhaler Inhale 2 puffs into the lungs every 4 (four) hours as needed for wheezing or shortness of breath. 1 Inhaler 1  . ibuprofen (ADVIL,MOTRIN) 600 MG tablet Take 1 tablet (600 mg total) by mouth every 8 (eight) hours as needed. 15 tablet 0  . magnesium oxide (MAG-OX) 400 MG tablet Take 400 mg by mouth daily.    . vitamin B-12 (CYANOCOBALAMIN) 1000 MCG tablet Take 1,000 mcg by mouth daily.     No current facility-administered medications for this visit.     OBJECTIVE: Vitals:   08/05/18 1139  BP: 129/79  Pulse: 93  Resp: 18  Temp: 98.7 F (37.1 C)  Body mass index is 42.71 kg/m.    ECOG FS:0 - Asymptomatic  General: Well-developed, well-nourished, no acute distress. Eyes: Pink conjunctiva, anicteric sclera. HEENT: Normocephalic, moist mucous membranes, clear oropharnyx. Lungs: Clear to auscultation bilaterally. Heart: Regular rate and rhythm. No rubs, murmurs, or gallops. Abdomen: Soft, nontender, nondistended. No organomegaly noted, normoactive bowel sounds. Musculoskeletal: No edema, cyanosis, or clubbing. Neuro: Alert, answering all questions  appropriately. Cranial nerves grossly intact. Skin: No rashes or petechiae noted. Psych: Normal affect. Lymphatics: No cervical, calvicular, axillary or inguinal LAD.   LAB RESULTS:  No results found for: NA, K, CL, CO2, GLUCOSE, BUN, CREATININE, CALCIUM, PROT, ALBUMIN, AST, ALT, ALKPHOS, BILITOT, GFRNONAA, GFRAA  Lab Results  Component Value Date   WBC 8.7 06/12/2011   NEUTROABS 6.6 (H) 06/12/2011   HGB 9.9 (L) 06/12/2011   HCT 27.7 (L) 06/13/2011   MCV 90 06/12/2011   PLT 198 06/12/2011     STUDIES: Ms Digital Screening Tomo Bilateral  Result Date: 07/16/2018 CLINICAL DATA:  Screening. 28 year old high risk female. Patient's mother was diagnosed with breast cancer at the age of 63. EXAM: DIGITAL SCREENING BILATERAL MAMMOGRAM WITH TOMO AND CAD COMPARISON:  None. ACR Breast Density Category b: There are scattered areas of fibroglandular density. FINDINGS: In the left breast, calcifications warrant further evaluation. In the right breast, no findings suspicious for malignancy. Images were processed with CAD. IMPRESSION: Further evaluation is suggested for calcifications in the left breast. RECOMMENDATION: Diagnostic mammogram of the left breast. (Code:[id number]-L-35M) The American Cancer Society recommends annual MRI and mammography in patients with an estimated lifetime risk of developing breast cancer greater than 20 - 25%, or who are known or suspected to be positive for the breast cancer gene. The patient will be contacted regarding the findings, and additional imaging will be scheduled. BI-RADS CATEGORY  0: Incomplete. Need additional imaging evaluation and/or prior mammograms for comparison. Electronically Signed   By: Baird Lyons M.D.   On: 07/16/2018 14:47   Ms Digital Diag Tomo Uni Left  Result Date: 07/25/2018 CLINICAL DATA:  28 year old female with a strong family history of breast cancer. The patient's mother was diagnosed with breast cancer at the age of 64. Patient was called back  from screening mammogram for possible calcifications in the left breast. EXAM: DIGITAL DIAGNOSTIC UNILATERAL LEFT MAMMOGRAM WITH CAD AND TOMO COMPARISON:  Baseline screening mammogram dated 07/16/2018. ACR Breast Density Category b: There are scattered areas of fibroglandular density. FINDINGS: Additional imaging of the left breast was performed. There is no persistent mass, distortion or malignant type microcalcifications. Mammographic images were processed with CAD. IMPRESSION: No evidence of malignancy in the left breast. RECOMMENDATION: Bilateral screening mammogram in 1 year is recommended. The American Cancer Society recommends annual MRI and mammography in patients with an estimated lifetime risk of developing breast cancer greater than 20 - 25%, or who are known or suspected to be positive for the breast cancer gene. I have discussed the findings and recommendations with the patient. Results were also provided in writing at the conclusion of the visit. If applicable, a reminder letter will be sent to the patient regarding the next appointment. BI-RADS CATEGORY  1: Negative. Electronically Signed   By: Baird Lyons M.D.   On: 07/25/2018 14:11    ASSESSMENT: High risk for breast cancer  PLAN:    1. High risk for breast cancer: Given patient's family history, she is at higher risk of developing breast cancer than the general population.  Because her mother was not  genetically tested prior to her death, patient was referred to genetics for testing and counseling.  She does not require breast MRI at this point, but this may be necessary in the future.  We also discussed the possibility of prophylactic tamoxifen for 5 years, but will await patient's genetic assessment before proceeding with any risk reducing treatments.  No further interventions are needed at this time.  Patient has been instructed to continue getting her yearly mammograms and follow-up with [facility] for continued yearly evaluations.  No  follow-up has been scheduled at this time.   I spent a total of 60 minutes face-to-face with the patient of which greater than 50% of the visit was spent in counseling and coordination of care as detailed above.  Patient expressed understanding and was in agreement with this plan. She also understands that She can call clinic at any time with any questions, concerns, or complaints.    Jeralyn Ruthsimothy J Chonda Baney, MD   08/08/2018 6:47 AM

## 2018-08-05 ENCOUNTER — Other Ambulatory Visit: Payer: Self-pay

## 2018-08-05 ENCOUNTER — Inpatient Hospital Stay: Payer: Self-pay | Attending: Oncology | Admitting: Oncology

## 2018-08-05 ENCOUNTER — Encounter: Payer: Self-pay | Admitting: Oncology

## 2018-08-05 DIAGNOSIS — Z803 Family history of malignant neoplasm of breast: Secondary | ICD-10-CM | POA: Insufficient documentation

## 2018-08-05 DIAGNOSIS — Z9189 Other specified personal risk factors, not elsewhere classified: Secondary | ICD-10-CM

## 2018-08-05 NOTE — Progress Notes (Signed)
Patient here today for initial evaluation regarding high risk breast cancer.

## 2018-08-07 ENCOUNTER — Inpatient Hospital Stay: Payer: Self-pay

## 2018-08-07 ENCOUNTER — Inpatient Hospital Stay (HOSPITAL_BASED_OUTPATIENT_CLINIC_OR_DEPARTMENT_OTHER): Payer: Self-pay | Admitting: Licensed Clinical Social Worker

## 2018-08-07 ENCOUNTER — Encounter: Payer: Self-pay | Admitting: Licensed Clinical Social Worker

## 2018-08-07 DIAGNOSIS — Z803 Family history of malignant neoplasm of breast: Secondary | ICD-10-CM

## 2018-08-08 NOTE — Progress Notes (Signed)
REFERRING PROVIDER: Finnegan, Timothy J, MD [address] [place], [place] [address]  PRIMARY PROVIDER:  Newton, Kimberly Niles, MD  PRIMARY REASON FOR VISIT:  1. Family history of breast cancer     HISTORY OF PRESENT ILLNESS:   Diane Myers, a 28 y.o. female, was seen for a [facility] cancer genetics consultation at the request of Dr. Finnegan due to a family history of breast cancer.  Diane Myers presents to clinic today to discuss the possibility of a hereditary predisposition to cancer, genetic testing, and to further clarify her future cancer risks, as well as potential cancer risks for family members.    Diane Myers is a 28 y.o. female with no personal history of cancer.    HORMONAL RISK FACTORS:  Menarche was at age 10.  First live birth at age 18.  OCP use for approximately 0 years.  Ovaries intact: yes.  Hysterectomy: no.  Menopausal status: premenopausal.  HRT use: 0 years. Colonoscopy: no; not examined. Mammogram within last year: yes  Past Medical History:  Diagnosis Date  . Family history of breast cancer   . Family history of breast cancer     Past Surgical History:  Procedure Laterality Date  . CHOLECYSTECTOMY      Social History   Socioeconomic History  . Marital status: Married    Spouse name: Not on file  . Number of children: Not on file  . Years of education: Not on file  . Highest education level: Not on file  Occupational History  . Not on file  Social Needs  . Financial resource strain: Not on file  . Food insecurity:    Worry: Not on file    Inability: Not on file  . Transportation needs:    Medical: Not on file    Non-medical: Not on file  Tobacco Use  . Smoking status: Current Every Day Smoker    Packs/day: 0.50  . Smokeless tobacco: Never Used  Substance and Sexual Activity  . Alcohol use: Yes  . Drug use: No  . Sexual activity: Yes  Lifestyle  . Physical activity:    Days per week: Not on file    Minutes per session: Not  on file  . Stress: Not on file  Relationships  . Social connections:    Talks on phone: Not on file    Gets together: Not on file    Attends religious service: Not on file    Active member of club or organization: Not on file    Attends meetings of clubs or organizations: Not on file    Relationship status: Not on file  Other Topics Concern  . Not on file  Social History Narrative  . Not on file     FAMILY HISTORY:  We obtained a detailed, 4-generation family history.  Significant diagnoses are listed below: Family History  Problem Relation Age of Onset  . Breast cancer Mother 28  . Breast cancer Maternal Aunt        2 maternal great aunts    Ms. Doo has 2 sons: 1 age 9, the other age 7. She has one full brother, age 31. She has a half sister through her father, age 12.  No cancers for her children/siblings.  Diane Myers mother was diagnosed with breast cancer at 27 and passed way at 31. The patient has 2 maternal aunts and a maternal uncle, all living approximately in their 40s. No cancers for these individuals or for her maternal cousins   as far as she knows. She does not know how old her maternal grandfather was when he died. He did have a niece who had breast cancer, unsure of the age of diagnosis. Her maternal grandmother is living in her 71s, and she has two sisters (the patient's maternal great aunts) who had breast cancer in their 22's. One is living.  Diane Myers father is living in his late 51s. The patient had one full paternal uncle who died at 65, and a half paternal uncle who is living and she has limited information about. No cancers for paternal cousins. Her paternal grandmother is living at 63 and paternal grandfather passed, unsure of what age.   Diane Myers is unaware of previous family history of genetic testing for hereditary cancer risks. Patient's maternal ancestors are of Caucasian descent, and paternal ancestors are of [place] descent.  There is no reported Ashkenazi Jewish ancestry. There is no known consanguinity.  GENETIC COUNSELING ASSESSMENT: Diane Myers is a 28 y.o. female with a family history of breast cancer which is somewhat suggestive of a Hereditary Cancer Predisposition Syndrome. We, therefore, discussed and recommended the following at today's visit.   DISCUSSION: We discussed that about 5-10% of breast cancer cases are hereditary with most cases due to BRCA mutations.  Other genes associated with hereditary breast cancer cases include ATM, CHEK2 and PALB2.  We reviewed the characteristics, features and inheritance patterns of hereditary cancer syndromes. We also discussed genetic testing, including the appropriate family members to test, the process of testing, insurance coverage and turn-around-time for results. We discussed the implications of a negative, positive and/or variant of uncertain significant result. We recommended Diane Myers pursue genetic testing for the [employer].  The Multi-Cancer Panel offered by [facility] includes sequencing and/or deletion duplication testing of the following 84 genes: AIP, ALK, APC, ATM, AXIN2,BAP1,  BARD1, BLM, BMPR1A, BRCA1, BRCA2, BRIP1, CASR, CDC73, CDH1, CDK4, CDKN1B, CDKN1C, CDKN2A (p14ARF), CDKN2A (p16INK4a), CEBPA, CHEK2, CTNNA1, DICER1, DIS3L2, EGFR (c.2369C>T, p.Thr790Met variant only), EPCAM (Deletion/duplication testing only), FH, FLCN, GATA2, GPC3, GREM1 (Promoter region deletion/duplication testing only), HOXB13 (c.251G>A, p.Gly84Glu), HRAS, KIT, MAX, MEN1, MET, MITF (c.952G>A, p.Glu318Lys variant only), MLH1, MSH2, MSH3, MSH6, MUTYH, NBN, NF1, NF2, NTHL1, PALB2, PDGFRA, PHOX2B, PMS2, POLD1, POLE, POT1, PRKAR1A, PTCH1, PTEN, RAD50, RAD51C, RAD51D, RB1, RECQL4, RET, RUNX1, SDHAF2, SDHA (sequence changes only), SDHB, SDHC, SDHD, SMAD4, SMARCA4, SMARCB1, SMARCE1, STK11, SUFU, TERC, TERT, TMEM127, TP53, TSC1, TSC2, VHL, WRN and WT1.   We discussed that if she  is found to have a mutation in one of these genes, it may impact future medical management recommendations such as increased cancer screenings and consideration of risk reducing surgeries.  A positive result could also have implications for the patient's family members.  A Negative result would mean we were unable to identify a hereditary component to her family history of cancer but does not rule out the possibility of a hereditary basis for her family history of cancer.  There could be mutations that are undetectable by current technology, or in genes not yet tested or identified to increase cancer risk.    We discussed the potential to find a Variant of Uncertain Significance or VUS.  These are variants that have not yet been identified as pathogenic or benign, and it is unknown if this variant is associated with increased cancer risk or if this is a normal finding.  Most VUS's are reclassified to benign or likely benign.   It should not be used  to make medical management decisions. With time, we suspect the lab will determine the significance of any VUS's identified if any.   Based on Diane Myers family history of cancer, she meets NCCN medical criteria for genetic testing. Despite that she meets criteria, she may still have an out of pocket cost. Since she is uninsured, we filled out the lab's patient assistance program form together which should allow the patient to have this testing at no cost.  Based on the patient's personal and family history, the risk model Tyrer Cuzick estimate her risk of developing breast cancer. This estimates her lifetime risk of developing breast cancer to be approximately 15.8%. This estimation is in the setting of negative test results and may change if she has a positive genetic test result.  This may be an underestimate as well since this model does not take into account her more distant relatives with breast cancer. The patient's lifetime breast cancer risk is a  preliminary estimate based on available information using one of several models endorsed by the American Cancer Society (ACS). The ACS recommends consideration of breast MRI screening as an adjunct to mammography for patients at high risk (defined as 20% or greater lifetime risk). A more detailed breast cancer risk assessment can be considered, if clinically indicated.     PLAN: After considering the risks, benefits, and limitations, Diane Myers  provided informed consent to pursue genetic testing and the blood sample was sent to [facility] for analysis of the Multi-Cancer Panel. Results should be available within approximately 2-3 weeks' time, at which point they will be disclosed by telephone to Diane Myers, as will any additional recommendations warranted by these results. Diane Myers will receive a summary of her genetic counseling visit and a copy of her results once available. This information will also be available in Epic.  Based on Diane Myers family history, we recommended her great aunt, who was diagnosed with breast cancer at in her 40s, have genetic counseling and testing. Diane Myers will let us know if we can be of any assistance in coordinating genetic counseling and/or testing for this family member.   Lastly, we encouraged Diane Myers to remain in contact with cancer genetics annually so that we can continuously update the family history and inform her of any changes in cancer genetics and testing that may be of benefit for this family.   Ms.  Myers questions were answered to her satisfaction today. Our contact information was provided should additional questions or concerns arise. Thank you for the referral and allowing us to share in the care of your patient.   Brianna Cowan, MS Genetic Counselor [email or web address] Phone: [phone]  The patient was seen for a total of 30 minutes in face-to-face genetic counseling. Dr. Finnegan was available for  questions regarding this case.   

## 2018-08-19 ENCOUNTER — Encounter: Payer: Self-pay | Admitting: Licensed Clinical Social Worker

## 2018-08-19 ENCOUNTER — Ambulatory Visit: Payer: Self-pay | Admitting: Licensed Clinical Social Worker

## 2018-08-19 ENCOUNTER — Telehealth: Payer: Self-pay | Admitting: Licensed Clinical Social Worker

## 2018-08-19 DIAGNOSIS — Z1379 Encounter for other screening for genetic and chromosomal anomalies: Secondary | ICD-10-CM | POA: Insufficient documentation

## 2018-08-19 NOTE — Progress Notes (Signed)
HPI:  Ms. Jump was previously seen in the [facility] clinic due to a family history of breast cancer and concerns regarding a hereditary predisposition to cancer. Please refer to our prior cancer genetics clinic note for more information regarding our discussion, assessment and recommendations, at the time. Ms. Aube recent genetic test results were disclosed to her, as were recommendations warranted by these results. These results and recommendations are discussed in more detail below.  CANCER HISTORY:   No history exists.    FAMILY HISTORY:  We obtained a detailed, 4-generation family history.  Significant diagnoses are listed below: Family History  Problem Relation Age of Onset  . Breast cancer Mother 27  . Breast cancer Maternal Aunt        2 maternal great aunts   Ms. Sayed has 2 sons: 1 age 9, the other age 21. She has one full brother, age 67. She has a half sister through her father, age 59.  No cancers for her children/siblings.  Ms. Giuliano mother was diagnosed with breast cancer at 42 and passed way at 65. The patient has 2 maternal aunts and a maternal uncle, all living approximately in their 26s. No cancers for these individuals or for her maternal cousins as far as she knows. She does not know how old her maternal grandfather was when he died. He did have a niece who had breast cancer, unsure of the age of diagnosis. Her maternal grandmother is living in her 55s, and she has two sisters (the patient's maternal great aunts) who had breast cancer in their 43's. One is living.  Ms. Mamone father is living in his late 32s. The patient had one full paternal uncle who died at 108, and a half paternal uncle who is living and she has limited information about. No cancers for paternal cousins. Her paternal grandmother is living at 33 and paternal grandfather passed, unsure of what age.   Ms. Mcgillis is unaware of previous family history of genetic testing for  hereditary cancer risks. Patient's maternal ancestors are of Caucasian descent, and paternal ancestors are of [place] descent. There is no reported Ashkenazi Jewish ancestry. There is no known consanguinity.  GENETIC TEST RESULTS: Genetic testing reported out on 08/18/2018 through the [facility] Multi-Cancer panel found no pathogenic mutations. The Multi-Cancer Panel offered by [facility] includes sequencing and/or deletion duplication testing of the following 84 genes: AIP, ALK, APC, ATM, AXIN2,BAP1,  BARD1, BLM, BMPR1A, BRCA1, BRCA2, BRIP1, CASR, CDC73, CDH1, CDK4, CDKN1B, CDKN1C, CDKN2A (p14ARF), CDKN2A (p16INK4a), CEBPA, CHEK2, CTNNA1, DICER1, DIS3L2, EGFR (c.2369C>T, p.Thr790Met variant only), EPCAM (Deletion/duplication testing only), FH, FLCN, GATA2, GPC3, GREM1 (Promoter region deletion/duplication testing only), HOXB13 (c.251G>A, p.Gly84Glu), HRAS, KIT, MAX, MEN1, MET, MITF (c.952G>A, p.Glu318Lys variant only), MLH1, MSH2, MSH3, MSH6, MUTYH, NBN, NF1, NF2, NTHL1, PALB2, PDGFRA, PHOX2B, PMS2, POLD1, POLE, POT1, PRKAR1A, PTCH1, PTEN, RAD50, RAD51C, RAD51D, RB1, RECQL4, RET, RUNX1, SDHAF2, SDHA (sequence changes only), SDHB, SDHC, SDHD, SMAD4, SMARCA4, SMARCB1, SMARCE1, STK11, SUFU, TERC, TERT, TMEM127, TP53, TSC1, TSC2, VHL, WRN and WT1.   The test report has been scanned into EPIC and is located under the Molecular Pathology section of the Results Review tab.  A portion of the result report is included below for reference.     We discussed with Ms. Huard that because current genetic testing is not perfect, it is possible there may be a gene mutation in one of these genes that current testing cannot detect, but that chance is small.  We also  discussed, that there could be another gene that has not yet been discovered, or that we have not yet tested, that is responsible for the cancer diagnoses in the family. It is also possible there is a hereditary cause for the cancer in the family that  Ms. Scrivens did not inherit and therefore was not identified in her testing.  Therefore, it is important to remain in touch with cancer genetics in the future so that we can continue to offer Ms. Iannaccone the most up to date genetic testing.   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  At this time, it is unknown if this variant is associated with increased cancer risk or if this is a normal finding, but most variants such as this get reclassified to being inconsequential. It should not be used to make medical management decisions. With time, we suspect the lab will determine the significance of this variant, if any. If we do learn more about it, we will try to contact Ms. Wickersham to discuss it further. However, it is important to stay in touch with [place] periodically and keep the address and phone number up to date.  ADDITIONAL GENETIC TESTING: We discussed with Ms. Game that her genetic testing was fairly extensive.  If there are genes identified to increase cancer risk that can be analyzed in the future, we would be happy to discuss and coordinate this testing at that time.    CANCER SCREENING RECOMMENDATIONS: Ms. Puzio test result is considered negative (normal).  This means that we have not identified a hereditary cause for her family history of cancer at this time.   While reassuring, this does not definitively rule out a hereditary predisposition to cancer. It is still possible that there could be genetic mutations that are undetectable by current technology. There could be genetic mutations in genes that have not been tested or identified to increase cancer risk.  Therefore, it is recommended she continue to follow the cancer management and screening guidelines provided by her primary healthcare provider.   Based on the patient's personal and family history, the risk model Harriett Rush estimate her risk of developing breast cancer.  This estimates her lifetime risk of developing breast cancer to be approximately 15.8%. This estimationis in the setting of negative test results and may change if she has a positive genetic test result.  This may be an underestimate as well since this model does not take into account her more distant relatives with breast cancer. The patient's lifetime breast cancer risk is a preliminary estimate based on available information using one of several models endorsed by the [place] (ACS). The ACS recommends consideration of breast MRI screening as an adjunct to mammography for patients at high risk (defined as 20% or greater lifetime risk). A more detailed breast cancer risk assessment can be considered, if clinically indicated.     An individual's cancer risk and medical management are not determined by genetic test results alone. Overall cancer risk assessment incorporates additional factors, including personal medical history, family history, and any available genetic information that may result in a personalized plan for cancer prevention and surveillance  RECOMMENDATIONS FOR FAMILY MEMBERS:  Women in this family might be at some increased risk of developing cancer, over the general population risk, simply due to the family history of cancer.  We recommended women in this family have a yearly mammogram beginning at age 37, or 21 years younger  than the earliest onset of cancer, an annual clinical breast exam, and perform monthly breast self-exams. Women in this family should also have a gynecological exam as recommended by their primary provider. All family members should have a colonoscopy by age 75.  It is also possible there is a hereditary cause for the cancer in Ms. Degidio's family that she did not inherit and therefore was not identified in her.  Based on Ms. Kothari's family history, we recommended her maternal relatives, including her brother, have genetic counseling and testing  given the family history of breast cancer. Ms. Bezdek will let us know if we can be of any assistance in coordinating genetic counseling and/or testing for these family members.  FOLLOW-UP: Lastly, we discussed with Ms. Demattia that cancer genetics is a rapidly advancing field and it is possible that new genetic tests will be appropriate for her and/or her family members in the future. We encouraged her to remain in contact with cancer genetics on an annual basis so we can update her personal and family histories and let her know of advances in cancer genetics that may benefit this family.   Our contact number was provided. Ms. Mulcahey questions were answered to her satisfaction, and she knows she is welcome to call us at anytime with additional questions or concerns.   Faith Rogue, MS Genetic Counselor [email or web address] Phone: [phone]

## 2018-08-19 NOTE — Telephone Encounter (Signed)
Revealed negative genetic testing.  Revealed that a VUS in NF1 was identified. This normal result is reassuring.  It is unlikely that there is an increased risk of cancer due to a mutation in one of these genes.  However, genetic testing is not perfect, and cannot definitively rule out a hereditary cause.  It will be important for her to keep in contact with genetics to learn if any additional testing may be needed in the future.   Her maternal relatives could benefit from genetic counseling/testing.

## 2018-09-03 ENCOUNTER — Other Ambulatory Visit: Payer: Self-pay

## 2018-09-03 ENCOUNTER — Emergency Department: Payer: Self-pay

## 2018-09-03 ENCOUNTER — Encounter: Payer: Self-pay | Admitting: *Deleted

## 2018-09-03 ENCOUNTER — Emergency Department
Admission: EM | Admit: 2018-09-03 | Discharge: 2018-09-03 | Disposition: A | Discharge status: Home / Self Care | Payer: Self-pay | Attending: Emergency Medicine | Admitting: Emergency Medicine

## 2018-09-03 DIAGNOSIS — F17210 Nicotine dependence, cigarettes, uncomplicated: Secondary | ICD-10-CM | POA: Insufficient documentation

## 2018-09-03 DIAGNOSIS — N3000 Acute cystitis without hematuria: Secondary | ICD-10-CM | POA: Insufficient documentation

## 2018-09-03 DIAGNOSIS — N938 Other specified abnormal uterine and vaginal bleeding: Secondary | ICD-10-CM | POA: Insufficient documentation

## 2018-09-03 LAB — CBC
HCT: 40.6 % (ref 36.0–46.0)
Hemoglobin: 13.7 g/dL (ref 12.0–15.0)
MCH: 31.7 pg (ref 26.0–34.0)
MCHC: 33.7 g/dL (ref 30.0–36.0)
MCV: 94 fL (ref 80.0–100.0)
PLATELETS: 317 10*3/uL (ref 150–400)
RBC: 4.32 MIL/uL (ref 3.87–5.11)
RDW: 12.4 % (ref 11.5–15.5)
WBC: 11.8 10*3/uL — ABNORMAL HIGH (ref 4.0–10.5)
nRBC: 0 % (ref 0.0–0.2)

## 2018-09-03 LAB — URINALYSIS, COMPLETE (UACMP) WITH MICROSCOPIC
BACTERIA UA: NONE SEEN
Bilirubin Urine: NEGATIVE
Glucose, UA: NEGATIVE mg/dL
Ketones, ur: NEGATIVE mg/dL
Nitrite: NEGATIVE
PROTEIN: 100 mg/dL — AB
RBC / HPF: >50 RBC/hpf — ABNORMAL HIGH (ref 0–5)
SPECIFIC GRAVITY, URINE: 1.018 (ref 1.005–1.030)
WBC, UA: >50 WBC/hpf — ABNORMAL HIGH (ref 0–5)
pH: 6 (ref 5.0–8.0)

## 2018-09-03 LAB — BASIC METABOLIC PANEL
Anion gap: 10 (ref 5–15)
BUN: 11 mg/dL (ref 6–20)
CALCIUM: 9.7 mg/dL (ref 8.9–10.3)
CO2: 24 mmol/L (ref 22–32)
CREATININE: 0.79 mg/dL (ref 0.44–1.00)
Chloride: 104 mmol/L (ref 98–111)
GFR calc non Af Amer: >60 mL/min (ref >60)
Glucose, Bld: 101 mg/dL — ABNORMAL HIGH (ref 70–99)
Potassium: 3.7 mmol/L (ref 3.5–5.1)
Sodium: 138 mmol/L (ref 135–145)

## 2018-09-03 LAB — POCT PREGNANCY, URINE: Preg Test, Ur: NEGATIVE

## 2018-09-03 MED ORDER — SULFAMETHOXAZOLE-TRIMETHOPRIM 800-160 MG PO TABS: 1.0000 | ORAL_TABLET | Freq: Two times a day (BID) | ORAL | 0 refills | Status: AC

## 2018-09-03 MED ORDER — ACETAMINOPHEN 500 MG PO TABS
1000.0000 mg | ORAL_TABLET | Freq: Once | ORAL | Status: AC
  Administered 2018-09-03: 1000 mg via ORAL
  Filled 2018-09-03: qty 2

## 2018-09-03 MED ORDER — SODIUM CHLORIDE 0.9 % IV BOLUS: 1000.0000 mL | Freq: Once | INTRAVENOUS | Status: DC

## 2018-09-03 MED ORDER — KETOROLAC TROMETHAMINE 30 MG/ML IJ SOLN
30.0000 mg | Freq: Once | INTRAMUSCULAR | Status: DC
  Filled 2018-09-03: qty 1

## 2018-09-03 MED ORDER — SULFAMETHOXAZOLE-TRIMETHOPRIM 800-160 MG PO TABS
1.0000 | ORAL_TABLET | Freq: Once | ORAL | Status: AC
  Administered 2018-09-03: 1 via ORAL
  Filled 2018-09-03: qty 1

## 2018-09-03 NOTE — Discharge Instructions (Signed)
Take the entire course of antibiotics, even if you are feeling better.  Make a follow-up appoint with your primary care physician.  Make a follow-up appointment with gynecologist if you continue to have vaginal bleeding.  Return to the emergency department if you develop severe pain, lightheadedness or fainting, fever, inability to keep down fluids, or any other symptoms concerning to you.

## 2018-09-03 NOTE — ED Provider Notes (Signed)
[facility] Emergency Department Provider Note  ____________________________________________  Time seen: Approximately 10:02 PM  I have reviewed the triage vital signs and the nursing notes.   HISTORY  Chief Complaint Flank Pain    HPI Diane Myers is a 28 y.o. female status post cholecystectomy presenting with right lower quadrant pain.  The pt reports that for 2-3 days, she has had R flank and RLQ pain that comes and goes.  She denies any associated nausea or vomiting, diarrhea, dysuria, hematuria or change in vaginal discharge.  She has had a month of intermittent vaginal bleeding.  Lightheadedness or syncope.  Past Medical History:  Diagnosis Date  . Family history of breast cancer   . Family history of breast cancer     Patient Active Problem List   Diagnosis Date Noted  . Genetic testing 08/19/2018  . Family history of breast cancer   . At high risk for breast cancer 08/03/2018    Past Surgical History:  Procedure Laterality Date  . CHOLECYSTECTOMY      Current Outpatient Rx  . Order #: [id number] Class: Normal  . Order #: [id number] Class: Print  . Order #: [id number] Class: Historical Med  . Order #: [id number] Class: Historical Med    Allergies Amoxicillin and Clindamycin  Family History  Problem Relation Age of Onset  . Breast cancer Mother 58  . Breast cancer Maternal Aunt        2 maternal great aunts    Social History Social History   Tobacco Use  . Smoking status: Current Every Day Smoker    Packs/day: 0.50  . Smokeless tobacco: Never Used  Substance Use Topics  . Alcohol use: Yes  . Drug use: No    Review of Systems Constitutional: No fever/chills.  No lightheadedness or syncope Eyes: No visual changes. ENT: No sore throat. No congestion or rhinorrhea. Cardiovascular: Denies chest pain. Denies palpitations. Respiratory: Denies shortness of breath.  No cough. Gastrointestinal: Right flank pain and right lower  quadrant abdominal pain.  No nausea, no vomiting.  No diarrhea.  No constipation. Genitourinary: Negative for dysuria.  No hematuria.  Positive dysfunctional uterine bleeding. Musculoskeletal: Negative for back pain. Skin: Negative for rash. Neurological: Negative for headaches. No focal numbness, tingling or weakness.     ____________________________________________   PHYSICAL EXAM:  VITAL SIGNS: ED Triage Vitals  Enc Vitals Group     BP 09/03/18 1922 (!) 150/76     Pulse Rate 09/03/18 1922 89     Resp 09/03/18 1922 18     Temp 09/03/18 1922 98.5 F (36.9 C)     Temp Source 09/03/18 1922 Oral     SpO2 09/03/18 1922 100 %     Weight 09/03/18 1923 240 lb (108.9 kg)     Height 09/03/18 1923 5\' 3"  (1.6 m)     Head Circumference --      Peak Flow --      Pain Score 09/03/18 1927 5     Pain Loc --      Pain Edu? --      Excl. in [place]? --     Constitutional: Alert and oriented.  Answers questions appropriately. Eyes: Conjunctivae are normal.  EOMI. No scleral icterus. Head: Atraumatic. Nose: No congestion/rhinnorhea. Mouth/Throat: Mucous membranes are moist.  Neck: No stridor.  Supple.   Cardiovascular: Normal rate, regular rhythm. No murmurs, rubs or gallops.  Respiratory: Normal respiratory effort.  No accessory muscle use or retractions. Lungs CTAB.  No wheezes, rales or  ronchi. Gastrointestinal: Soft, nontender and nondistended.  No reproducible tenderness on my examination.  No guarding or rebound.  No peritoneal signs. Musculoskeletal: No LE edema.  Neurologic:  A&Ox3.  Speech is clear.  Face and smile are symmetric.  EOMI.  Moves all extremities well. Skin:  Skin is warm, dry and intact. No rash noted. Psychiatric: Mood and affect are normal. Speech and behavior are normal.  Normal judgement. ____________________________________________   LABS (all labs ordered are listed, but only abnormal results are displayed)  Labs Reviewed  BASIC METABOLIC PANEL - Abnormal;  Notable for the following components:      Result Value   Glucose, Bld 101 (*)    All other components within normal limits  CBC - Abnormal; Notable for the following components:   WBC 11.8 (*)    All other components within normal limits  URINALYSIS, COMPLETE (UACMP) WITH MICROSCOPIC - Abnormal; Notable for the following components:   Color, Urine YELLOW (*)    APPearance CLOUDY (*)    Hgb urine dipstick LARGE (*)    Protein, ur 100 (*)    Leukocytes,Ua MODERATE (*)    RBC / HPF >50 (*)    WBC, UA >50 (*)    All other components within normal limits  POC URINE PREG, ED  POCT PREGNANCY, URINE   ____________________________________________  EKG  Not indicated ____________________________________________  RADIOLOGY  No results found.  ____________________________________________   PROCEDURES  Procedure(s) performed: None  Procedures  Critical Care performed: No ____________________________________________   INITIAL IMPRESSION / ASSESSMENT AND PLAN / ED COURSE  Pertinent labs & imaging results that were available during my care of the patient were reviewed by me and considered in my medical decision making (see chart for details).  28 y.o. pregnant female presenting with right flank and right lower quadrant pain.  Overall, the patient is hemodynamically stable and afebrile.  Her urinalysis is concerning for UTI but she does have red blood cells and there.  This could be due to her vaginal bleeding however renal colic is not excluded we will get a CT scan.  Early appendicitis or ovarian pathology including ovarian cyst is possible.  Ovarian torsion is very unlikely.  I have discussed ultrasound imaging with the patient, and she is in agreement to defer this test if the CT is negative as it would not change our management because the likelihood that she has severe hemorrhaging or torsion is very low.  The patient will receive her first dose of oral antibiotics for her UTI  here, and will plan to supplement her pain control with Tylenol as the initial dose of Motrin that she took at home significantly helped.  Plan reevaluation for final disposition.  ____________________________________________  FINAL CLINICAL IMPRESSION(S) / ED DIAGNOSES  Final diagnoses:  Acute cystitis without hematuria  Dysfunctional uterine bleeding         NEW MEDICATIONS STARTED DURING THIS VISIT:  Discharge Medication List as of 09/03/2018 11:47 PM    START taking these medications   Details  sulfamethoxazole-trimethoprim (BACTRIM DS,SEPTRA DS) 800-160 MG tablet Take 1 tablet by mouth 2 (two) times daily for 5 days., Starting Wed 09/03/2018, Until Mon 09/08/2018, Print          Rockne Menghini, MD 09/09/18 [phone]

## 2018-09-03 NOTE — ED Triage Notes (Signed)
Pt has left flank pain.  Pt reports nausea.   Pt has new implant for birth control.  Pt reports vaginal spotting for 2-3 days.  Pt alert

## 2018-09-03 NOTE — ED Notes (Signed)
poct pregnancy Negative 

## 2019-03-13 ENCOUNTER — Other Ambulatory Visit: Payer: Self-pay

## 2019-03-13 DIAGNOSIS — Z20822 Contact with and (suspected) exposure to covid-19: Secondary | ICD-10-CM

## 2019-03-14 LAB — NOVEL CORONAVIRUS, NAA: SARS-CoV-2, NAA: NOT DETECTED

## 2019-04-10 ENCOUNTER — Other Ambulatory Visit: Payer: Self-pay | Admitting: *Deleted

## 2019-04-10 DIAGNOSIS — Z20822 Contact with and (suspected) exposure to covid-19: Secondary | ICD-10-CM

## 2019-04-11 LAB — NOVEL CORONAVIRUS, NAA: SARS-CoV-2, NAA: DETECTED — AB

## 2019-05-06 ENCOUNTER — Encounter: Payer: Self-pay | Admitting: Emergency Medicine

## 2019-05-06 ENCOUNTER — Other Ambulatory Visit: Payer: Self-pay

## 2019-05-06 ENCOUNTER — Emergency Department
Admission: EM | Admit: 2019-05-06 | Discharge: 2019-05-06 | Disposition: A | Discharge status: Home / Self Care | Payer: Self-pay | Attending: Emergency Medicine | Admitting: Emergency Medicine

## 2019-05-06 DIAGNOSIS — Z79899 Other long term (current) drug therapy: Secondary | ICD-10-CM | POA: Insufficient documentation

## 2019-05-06 DIAGNOSIS — F17210 Nicotine dependence, cigarettes, uncomplicated: Secondary | ICD-10-CM | POA: Insufficient documentation

## 2019-05-06 DIAGNOSIS — L089 Local infection of the skin and subcutaneous tissue, unspecified: Secondary | ICD-10-CM | POA: Insufficient documentation

## 2019-05-06 MED ORDER — MUPIROCIN CALCIUM 2 % EX CREA: TOPICAL_CREAM | CUTANEOUS | 0 refills | Status: AC

## 2019-05-06 MED ORDER — TRAMADOL HCL 50 MG PO TABS: 50.0000 mg | ORAL_TABLET | Freq: Four times a day (QID) | ORAL | 0 refills | Status: AC | PRN

## 2019-05-06 MED ORDER — SULFAMETHOXAZOLE-TRIMETHOPRIM 800-160 MG PO TABS: 1.0000 | ORAL_TABLET | Freq: Two times a day (BID) | ORAL | 0 refills | Status: DC

## 2019-05-06 MED ORDER — NAPROXEN 500 MG PO TABS: 500.0000 mg | ORAL_TABLET | Freq: Two times a day (BID) | ORAL | Status: DC

## 2019-05-06 NOTE — ED Provider Notes (Signed)
[facility] Emergency Department Provider Note   ____________________________________________   First MD Initiated Contact with Patient 05/06/19 [phone]     (approximate)  I have reviewed the triage vital signs and the nursing notes.   HISTORY  Chief Complaint Abscess    HPI Diane Myers is a 28 y.o. female patient presents with papular lesion lesions on erythematous base to the medial right thigh fold area. 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  Patient works in a daycare.  Was advised not return until area was treated.  Patient denies drainage.  Patient rates pain as a 3/10.  Patient described pain as "sore".         Past Medical History:  Diagnosis Date  . Family history of breast cancer   . Family history of breast cancer     Patient Active Problem List   Diagnosis Date Noted  . Genetic testing 08/19/2018  . Family history of breast cancer   . At high risk for breast cancer 08/03/2018    Past Surgical History:  Procedure Laterality Date  . CHOLECYSTECTOMY      Prior to Admission medications   Medication Sig Start Date End Date Taking? Authorizing Provider  albuterol (PROVENTIL HFA;VENTOLIN HFA) 108 (90 Base) MCG/ACT inhaler Inhale 2 puffs into the lungs every 4 (four) hours as needed for wheezing or shortness of breath. 05/18/18   Triplett, Rulon Eisenmengerari B, FNP  ibuprofen (ADVIL,MOTRIN) 600 MG tablet Take 1 tablet (600 mg total) by mouth every 8 (eight) hours as needed. 11/10/16   Joni ReiningSmith,  K, PA-C  magnesium oxide (MAG-OX) 400 MG tablet Take 400 mg by mouth daily.    [provider]  mupirocin cream (BACTROBAN) 2 % Apply to affected area 3 times daily 05/06/19 05/05/20  Joni ReiningSmith,  K, PA-C  naproxen (NAPROSYN) 500 MG tablet Take 1 tablet (500 mg total) by mouth 2 (two) times daily with a meal. 05/06/19   Joni ReiningSmith,  K, PA-C  sulfamethoxazole-trimethoprim  (BACTRIM DS) 800-160 MG tablet Take 1 tablet by mouth 2 (two) times daily. 05/06/19   Joni ReiningSmith,  K, PA-C  traMADol (ULTRAM) 50 MG tablet Take 1 tablet (50 mg total) by mouth every 6 (six) hours as needed for up to 3 days. 05/06/19 05/09/19  Joni ReiningSmith,  K, PA-C  vitamin B-12 (CYANOCOBALAMIN) 1000 MCG tablet Take 1,000 mcg by mouth daily.    [provider]    Allergies Amoxicillin and Clindamycin  Family History  Problem Relation Age of Onset  . Breast cancer Mother 6628  . Breast cancer Maternal Aunt        2 maternal great aunts    Social History Social History   Tobacco Use  . Smoking status: Current Every Day Smoker    Packs/day: 0.50  . Smokeless tobacco: Never Used  Substance Use Topics  . Alcohol use: Yes  . Drug use: No    Review of Systems Constitutional: No fever/chills Eyes: No visual changes. ENT: No sore throat. Cardiovascular: Denies chest pain. Respiratory: Denies shortness of breath. Gastrointestinal: No abdominal pain.  No nausea, no vomiting.  No diarrhea.  No constipation. Genitourinary: Negative for dysuria. Musculoskeletal: Negative for back pain. Skin: Negative for rash. Neurological: Negative for headaches, focal weakness or numbness. Allergic/Immunilogical: Amoxicillin and clindamycin. ____________________________________________   PHYSICAL EXAM:  VITAL SIGNS: ED Triage Vitals  Enc Vitals Group     BP 05/06/19 0817 131/66     Pulse Rate  05/06/19 0817 (!) 108     Resp 05/06/19 0817 18     Temp 05/06/19 0817 98.3 F (36.8 C)     Temp Source 05/06/19 0817 Oral     SpO2 05/06/19 0817 100 %     Weight 05/06/19 0817 250 lb (113.4 kg)     Height 05/06/19 0817 5\' 3"  (1.6 m)     Head Circumference --      Peak Flow --      Pain Score 05/06/19 0823 3     Pain Loc --      Pain Edu? --      Excl. in [place]? --    Constitutional: Alert and oriented. Well appearing and in no acute distress. Cardiovascular: Normal rate, regular rhythm.  Grossly normal heart sounds.  Good peripheral circulation. Respiratory: Normal respiratory effort.  No retractions. Lungs CTAB. Musculoskeletal: No lower extremity tenderness nor edema.  No joint effusions. Neurologic:  Normal speech and language. No gross focal neurologic deficits are appreciated. No gait instability. Skin: Nonfluctuant papular nodular lesion to the medial thigh fold area of the right left lower extremity Psychiatric: Mood and affect are normal. Speech and behavior are normal.  ____________________________________________   LABS (all labs ordered are listed, but only abnormal results are displayed)  Labs Reviewed - No data to display ____________________________________________  EKG   ____________________________________________  RADIOLOGY  ED MD interpretation:    Official radiology report(s): No results found.  ____________________________________________   PROCEDURES  Procedure(s) performed (including Critical Care):  Procedures   ____________________________________________   INITIAL IMPRESSION / ASSESSMENT AND PLAN / ED COURSE  As part of my medical decision making, I reviewed the following data within the [place]     Patient presents with infection to the medial thigh fold of the right lower extremity.  This is a recurrent condition patient but has not been treated with antibiotics.  Recurrence is due to patient body habitus.  Patient given discharge care instructions and advised to take medication as directed.  Follow-up with PCP.    Diane Myers was evaluated in Emergency Department on 05/06/2019 for the symptoms described in the history of present illness. She was evaluated in the context of the global COVID-19 pandemic, which necessitated consideration that the patient might be at risk for infection with the SARS-CoV-2 virus that causes COVID-19. Institutional protocols and algorithms that pertain to the evaluation of  patients at risk for COVID-19 are in a state of rapid change based on information released by regulatory bodies including the CDC and federal and state organizations. These policies and algorithms were followed during the patient's care in the ED.        ____________________________________________   FINAL CLINICAL IMPRESSION(S) / ED DIAGNOSES  Final diagnoses:  Skin infection     ED Discharge Orders         Ordered    sulfamethoxazole-trimethoprim (BACTRIM DS) 800-160 MG tablet  2 times daily     05/06/19 0843    mupirocin cream (BACTROBAN) 2 %     05/06/19 0843    naproxen (NAPROSYN) 500 MG tablet  2 times daily with meals     05/06/19 0843    traMADol (ULTRAM) 50 MG tablet  Every 6 hours PRN     05/06/19 0843           Note:  This document was prepared using Dragon voice recognition software and may include unintentional dictation errors.    Sable Feil, PA-C  05/06/19 2585    Minna Antis, MD 05/06/19 1437

## 2019-05-06 NOTE — ED Triage Notes (Signed)
Presents with possible abscess area to right upper leg

## 2019-05-06 NOTE — Discharge Instructions (Signed)
Take and apply medication as directed.

## 2019-11-26 ENCOUNTER — Telehealth: Payer: Self-pay | Admitting: General Practice

## 2019-11-26 NOTE — Telephone Encounter (Signed)
LVM regarding application that was mailed out. Application mailed out 3/10 and has not brought it back in.

## 2019-12-08 ENCOUNTER — Telehealth: Payer: Self-pay | Admitting: General Practice

## 2019-12-08 NOTE — Telephone Encounter (Signed)
Individual has been contacted 3+ times regarding ED referral and has been given information regarding how to become a pt in the clinic. No further attempts at contacting the pt will be made.

## 2020-03-17 ENCOUNTER — Ambulatory Visit: Payer: Self-pay

## 2020-03-17 ENCOUNTER — Other Ambulatory Visit: Payer: Self-pay

## 2020-03-17 ENCOUNTER — Ambulatory Visit
Admission: EM | Admit: 2020-03-17 | Discharge: 2020-03-17 | Disposition: A | Discharge status: Home / Self Care | Payer: Self-pay | Attending: Physician Assistant | Admitting: Physician Assistant

## 2020-03-17 ENCOUNTER — Ambulatory Visit (INDEPENDENT_AMBULATORY_CARE_PROVIDER_SITE_OTHER): Payer: Self-pay

## 2020-03-17 ENCOUNTER — Encounter: Payer: Self-pay | Admitting: Emergency Medicine

## 2020-03-17 DIAGNOSIS — M25572 Pain in left ankle and joints of left foot: Secondary | ICD-10-CM

## 2020-03-17 DIAGNOSIS — S93402A Sprain of unspecified ligament of left ankle, initial encounter: Secondary | ICD-10-CM

## 2020-03-17 NOTE — ED Provider Notes (Addendum)
[facility]    CSN: [id number] Arrival date & time: 03/17/20  1647      History   Chief Complaint Chief Complaint  Patient presents with  . Ankle Pain    HPI Diane Myers is a 29 y.o. female presenting for left ankle pain and swelling since last night.  She says that she got scared by a cricket and jumped.  She says she landed on her ankle and started to have pain.  She says she is taking Aleve for the pain.  She has not iced the area.  She said she has been able to move the ankle and when she walks on it.  She has pain of the lateral ankle that sometimes radiates to the foot.  Admits to history of ankle sprains but denies any fractures.  Denies any surgeries on the affected foot.  She denies any numbness, weakness or tingling.  No other injuries, complaints or concerns.  HPI  Past Medical History:  Diagnosis Date  . Family history of breast cancer   . Family history of breast cancer     Patient Active Problem List   Diagnosis Date Noted  . Genetic testing 08/19/2018  . Family history of breast cancer   . At high risk for breast cancer 08/03/2018    Past Surgical History:  Procedure Laterality Date  . CHOLECYSTECTOMY      OB History   No obstetric history on file.      Home Medications    Prior to Admission medications   Medication Sig Start Date End Date Taking? Authorizing Provider  albuterol (PROVENTIL HFA;VENTOLIN HFA) 108 (90 Base) MCG/ACT inhaler Inhale 2 puffs into the lungs every 4 (four) hours as needed for wheezing or shortness of breath. 05/18/18   Triplett, Rulon Eisenmenger B, FNP  ibuprofen (ADVIL,MOTRIN) 600 MG tablet Take 1 tablet (600 mg total) by mouth every 8 (eight) hours as needed. 11/10/16   Joni Reining, PA-C  magnesium oxide (MAG-OX) 400 MG tablet Take 400 mg by mouth daily.    [provider]  mupirocin cream (BACTROBAN) 2 % Apply to affected area 3 times daily 05/06/19 05/05/20  Joni Reining, PA-C  naproxen (NAPROSYN) 500 MG  tablet Take 1 tablet (500 mg total) by mouth 2 (two) times daily with a meal. 05/06/19   Joni Reining, PA-C  sulfamethoxazole-trimethoprim (BACTRIM DS) 800-160 MG tablet Take 1 tablet by mouth 2 (two) times daily. 05/06/19   Joni Reining, PA-C  vitamin B-12 (CYANOCOBALAMIN) 1000 MCG tablet Take 1,000 mcg by mouth daily.    [provider]    Family History Family History  Problem Relation Age of Onset  . Breast cancer Mother 43  . Breast cancer Maternal Aunt        2 maternal great aunts    Social History Social History   Tobacco Use  . Smoking status: Current Every Day Smoker    Packs/day: 0.50  . Smokeless tobacco: Never Used  Substance Use Topics  . Alcohol use: Yes  . Drug use: No     Allergies   Amoxicillin and Clindamycin   Review of Systems Review of Systems  Constitutional: Negative for fatigue and fever.  Musculoskeletal: Positive for arthralgias, gait problem and joint swelling.  Skin: Negative for color change, rash and wound.  Neurological: Negative for weakness and numbness.     Physical Exam Triage Vital Signs ED Triage Vitals  Enc Vitals Group     BP 03/17/20  1738 125/80     Pulse Rate 03/17/20 1738 79     Resp --      Temp 03/17/20 1738 98.3 F (36.8 C)     Temp Source 03/17/20 1738 Oral     SpO2 03/17/20 1738 100 %     Weight --      Height --      Head Circumference --      Peak Flow --      Pain Score 03/17/20 1735 4     Pain Loc --      Pain Edu? --      Excl. in [place]? --    No data found.  Updated Vital Signs BP 125/80 (BP Location: Left Arm)   Pulse 79   Temp 98.3 F (36.8 C) (Oral)   SpO2 100%    Physical Exam Vitals and nursing note reviewed.  Constitutional:      General: She is not in acute distress.    Appearance: Normal appearance. She is not ill-appearing or toxic-appearing.  HENT:     Head: Normocephalic and atraumatic.  Eyes:     General: No scleral icterus.       Right eye: No discharge.         Left eye: No discharge.     Conjunctiva/sclera: Conjunctivae normal.  Cardiovascular:     Rate and Rhythm: Normal rate and regular rhythm.     Pulses: Normal pulses.  Pulmonary:     Effort: Pulmonary effort is normal. No respiratory distress.  Musculoskeletal:     Cervical back: Neck supple.     Left ankle: Swelling (moderate lateral ankle swelling) present. No ecchymosis. Tenderness present over the lateral malleolus and ATF ligament. Decreased range of motion (due to pain and guarding). Normal pulse.  Skin:    General: Skin is dry.  Neurological:     General: No focal deficit present.     Mental Status: She is alert. Mental status is at baseline.     Motor: No weakness.     Gait: Gait normal.  Psychiatric:        Mood and Affect: Mood normal.        Behavior: Behavior normal.        Thought Content: Thought content normal.      UC Treatments / Results  Labs (all labs ordered are listed, but only abnormal results are displayed) Labs Reviewed - No data to display  EKG   Radiology DG Ankle Complete Left  Result Date: 03/17/2020 CLINICAL DATA:  Ankle pain EXAM: LEFT ANKLE COMPLETE - 3+ VIEW COMPARISON:  None. FINDINGS: There is no evidence of fracture, dislocation, or joint effusion. There is no evidence of arthropathy or other focal bone abnormality. Soft tissues are unremarkable. IMPRESSION: Negative. Electronically Signed   By: Jasmine Pang M.D.   On: 03/17/2020 18:38    Procedures Procedures (including critical care time)  Medications Ordered in UC Medications - No data to display  Initial Impression / Assessment and Plan / UC Course  I have reviewed the triage vital signs and the nursing notes.  Pertinent labs & imaging results that were available during my care of the patient were reviewed by me and considered in my medical decision making (see chart for details).   X-ray negative for fractures.  Patient provided with ACE wrap.  Advised RICE, NSAIDs and Tylenol  for pain relief as needed.  Follow-up with our clinic as needed.  Final Clinical Impressions(s) / UC Diagnoses  Final diagnoses:  Sprain of left ankle, unspecified ligament, initial encounter  Acute left ankle pain     Discharge Instructions     SPRAIN: Stressed avoiding painful activities . Reviewed RICE guidelines. Use medications as directed, including NSAIDs. If no NSAIDs have been prescribed for you today, you may take Aleve or Motrin over the counter. May use Tylenol in between doses of NSAIDs.  If no improvement in the next 1-2 weeks, f/u with PCP or return to our office for reexamination, and please feel free to call or return at any time for any questions or concerns you may have and we will be happy to help you!        ED Prescriptions    None     PDMP not reviewed this encounter.   Shirlee Latch, PA-C 03/17/20 1858    Shirlee Latch, PA-C 03/17/20 1904

## 2020-03-17 NOTE — Discharge Instructions (Signed)
SPRAIN: Stressed avoiding painful activities . Reviewed RICE guidelines. Use medications as directed, including NSAIDs. If no NSAIDs have been prescribed for you today, you may take Aleve or Motrin over the counter. May use Tylenol in between doses of NSAIDs.  If no improvement in the next 1-2 weeks, f/u with PCP or return to our office for reexamination, and please feel free to call or return at any time for any questions or concerns you may have and we will be happy to help you!     

## 2020-03-17 NOTE — ED Triage Notes (Signed)
Pt c/o left ankle pain onset yesterday. She states she saw a camel cricket and jumped back and landed and felt ankle pain. Pt states she was outside and on concrete. She feels there is some swelling today and she is having pain with ROM. Pain is radiates into foot and toes.

## 2020-05-03 IMAGING — MG DIGITAL DIAGNOSTIC UNILATERAL LEFT MAMMOGRAM WITH TOMO AND CAD
7 series · 8 of 15 positions shown · non-contrast
Comparison: Baseline screening mammogram dated 07/16/2018.

CLINICAL DATA: 19-year-old female with a strong family history of
breast cancer. The patient's mother was diagnosed with breast cancer
at the age of 28. Patient was called back from screening mammogram
for possible calcifications in the left breast.

EXAM:
DIGITAL DIAGNOSTIC UNILATERAL LEFT MAMMOGRAM WITH CAD AND TOMO

[L CC (1 of 2)]
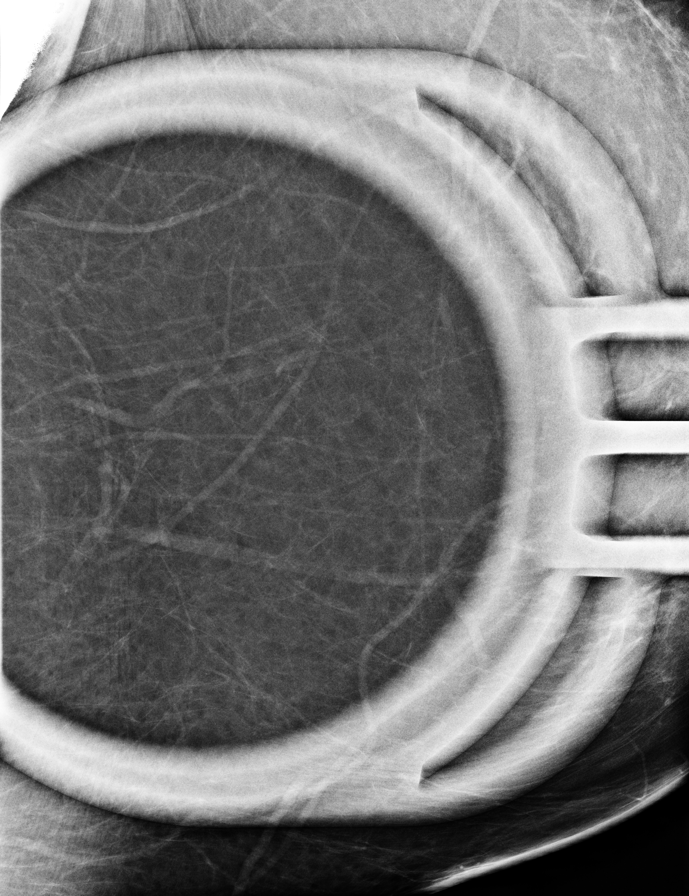

[L ML]
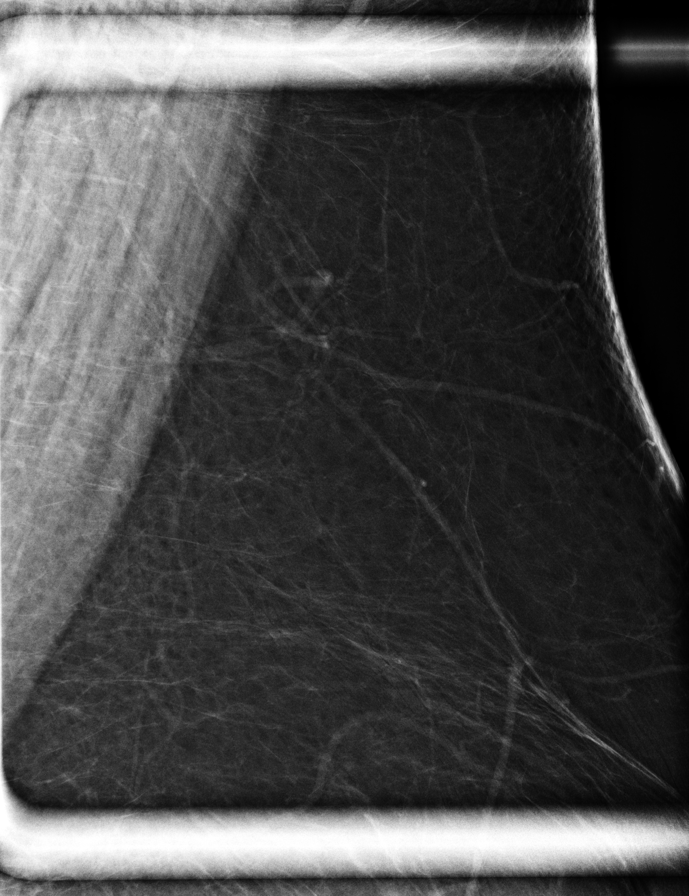

[L CC (2 of 2)]
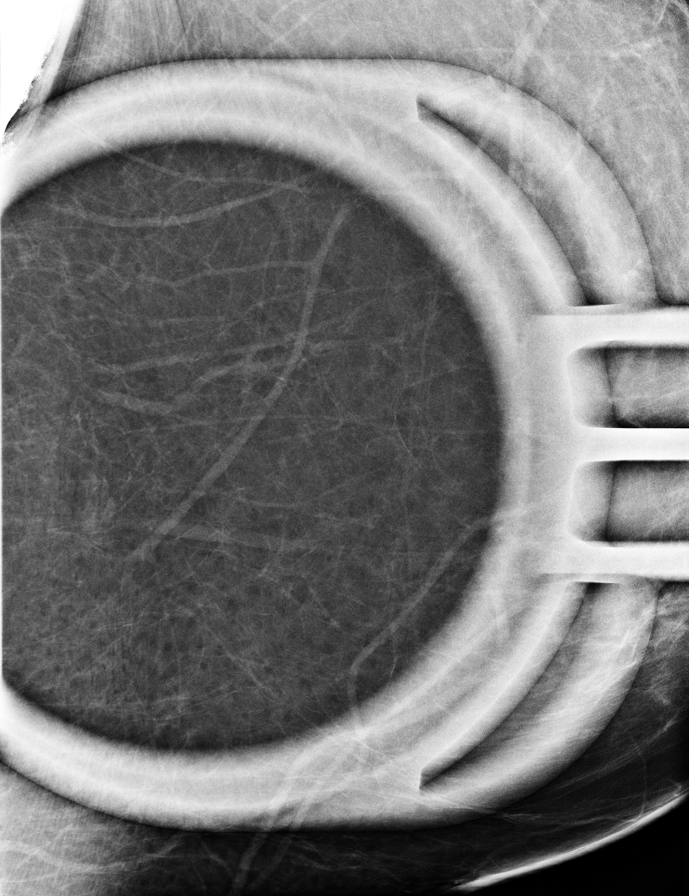

[L ML synth-2D]
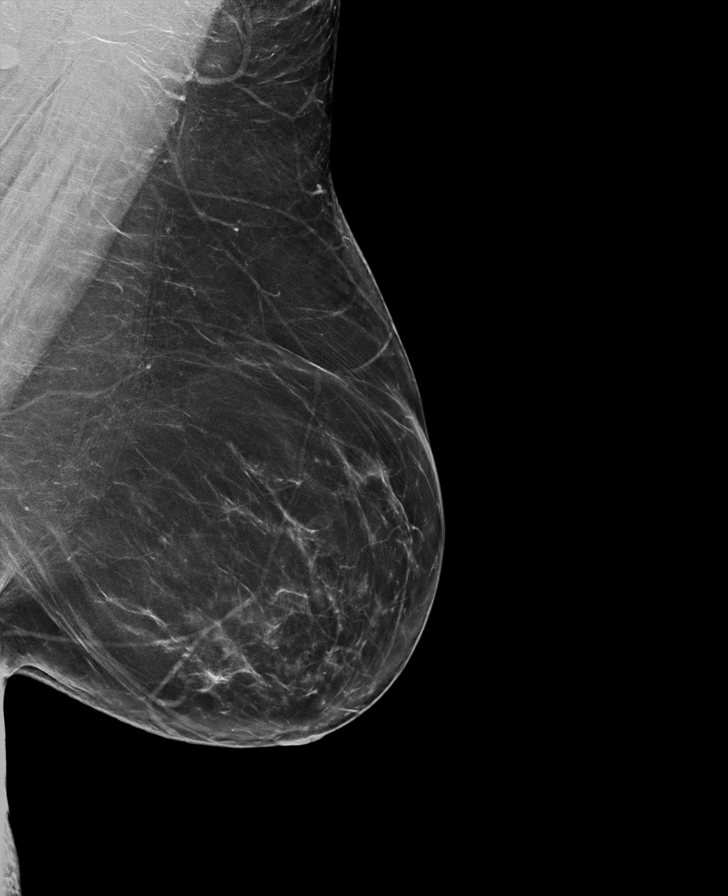

[L CC synth-2D]
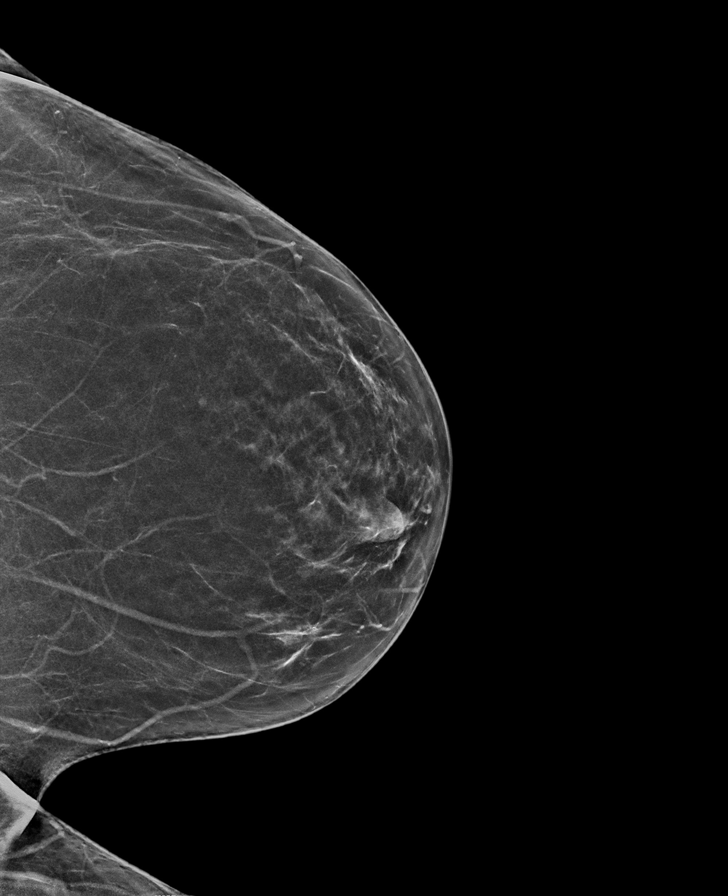

[L ML tomo · 2 of 79 frames shown]
[frame 26/79]
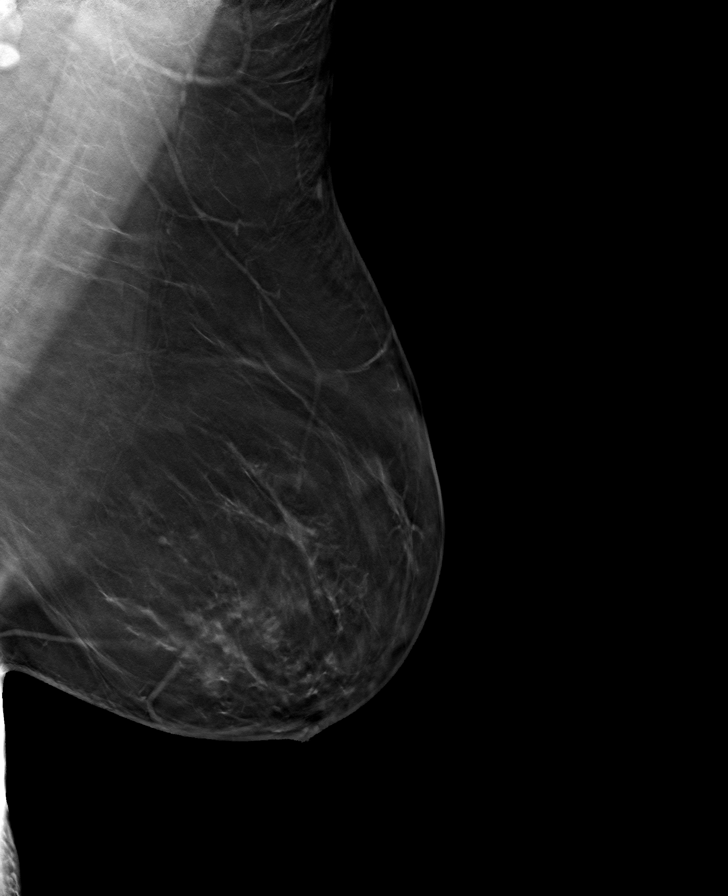
[frame 40/79]
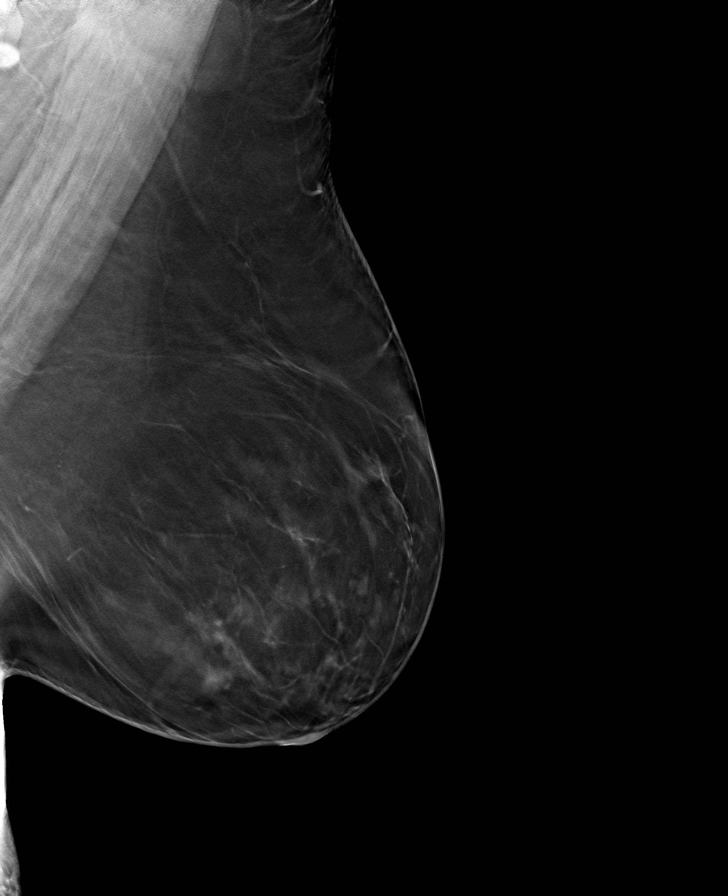

[L CC tomo · tomo slice 31/60.0]
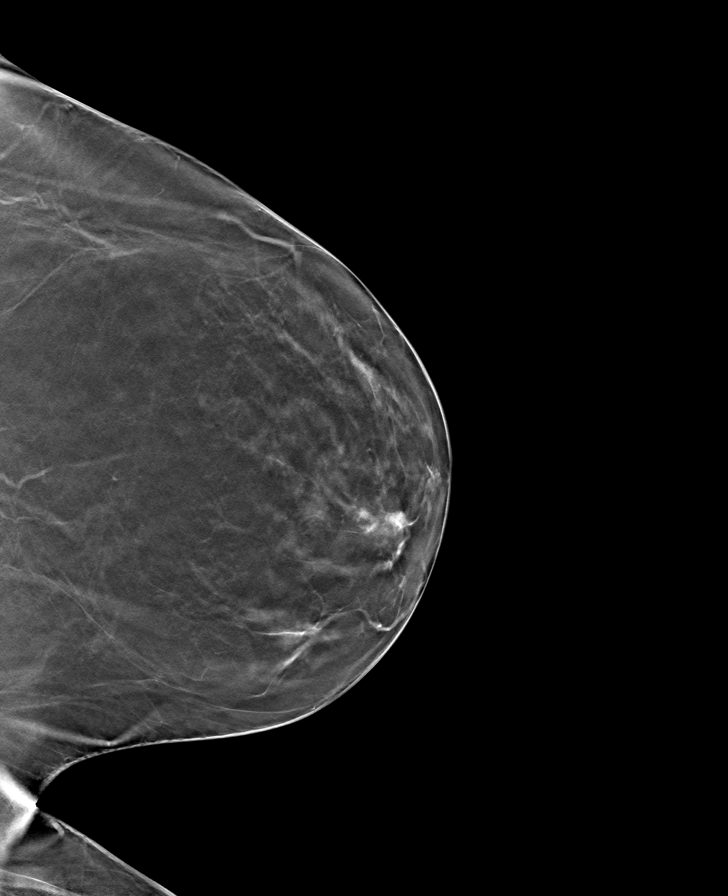

[8 of 15 positions shown; findings below may reference images not displayed]

ACR Breast Density Category b: There are scattered areas of
fibroglandular density.
FINDINGS: Additional imaging of the left breast was performed. There is no
persistent mass, distortion or malignant type microcalcifications.

Mammographic images were processed with CAD.
IMPRESSION: No evidence of malignancy in the left breast.

RECOMMENDATION:
Bilateral screening mammogram in 1 year is recommended. The American
Cancer Society recommends annual MRI and mammography in patients
with an estimated lifetime risk of developing breast cancer greater
than 20 - 25%, or who are known or suspected to be positive for the
breast cancer gene.

I have discussed the findings and recommendations with the patient.
Results were also provided in writing at the conclusion of the
visit. If applicable, a reminder letter will be sent to the patient
regarding the next appointment.

BI-RADS CATEGORY  1: Negative.

## 2020-08-10 ENCOUNTER — Other Ambulatory Visit: Payer: Self-pay

## 2020-08-10 ENCOUNTER — Ambulatory Visit
Admission: RE | Admit: 2020-08-10 | Discharge: 2020-08-10 | Disposition: A | Discharge status: Home / Self Care | Payer: HRSA Program | Source: Ambulatory Visit | Attending: Medical Oncology | Admitting: Medical Oncology

## 2020-08-10 VITALS — BP 124/61 | HR 76 | Temp 98.3°F | Resp 18 | Ht 63.0 in | Wt 235.0 lb

## 2020-08-10 DIAGNOSIS — Z880 Allergy status to penicillin: Secondary | ICD-10-CM | POA: Diagnosis not present

## 2020-08-10 DIAGNOSIS — R519 Headache, unspecified: Secondary | ICD-10-CM | POA: Diagnosis not present

## 2020-08-10 DIAGNOSIS — Z882 Allergy status to sulfonamides status: Secondary | ICD-10-CM | POA: Insufficient documentation

## 2020-08-10 DIAGNOSIS — J029 Acute pharyngitis, unspecified: Secondary | ICD-10-CM | POA: Diagnosis present

## 2020-08-10 DIAGNOSIS — Z791 Long term (current) use of non-steroidal anti-inflammatories (NSAID): Secondary | ICD-10-CM | POA: Diagnosis not present

## 2020-08-10 DIAGNOSIS — Z79899 Other long term (current) drug therapy: Secondary | ICD-10-CM | POA: Insufficient documentation

## 2020-08-10 DIAGNOSIS — F17210 Nicotine dependence, cigarettes, uncomplicated: Secondary | ICD-10-CM | POA: Insufficient documentation

## 2020-08-10 DIAGNOSIS — Z881 Allergy status to other antibiotic agents status: Secondary | ICD-10-CM | POA: Diagnosis not present

## 2020-08-10 DIAGNOSIS — Z20822 Contact with and (suspected) exposure to covid-19: Secondary | ICD-10-CM | POA: Diagnosis not present

## 2020-08-10 LAB — GROUP A STREP BY PCR: Group A Strep by PCR: NOT DETECTED

## 2020-08-10 LAB — SARS CORONAVIRUS 2 (TAT 6-24 HRS): SARS Coronavirus 2: NEGATIVE

## 2020-08-10 NOTE — ED Triage Notes (Signed)
Pt c/o sore throat and cough since yesterday. Pt took an at-home COVID test today and it was negative. Pt works at [employer]. She reports someone at her work was treated for strep last week. She denies f/n/v/d or other symptoms.

## 2020-08-10 NOTE — ED Provider Notes (Signed)
[facility]    CSN: [id number] Arrival date & time: 08/10/20  1040      History   Chief Complaint Chief Complaint  Patient presents with  . Sore Throat    HPI Diane Myers is a 30 y.o. female.   HPI  Sore Throat: Patient reports that she has had a sore throat and headache for 1 day.  She states that her sore throat is about a 2 out of 10 in nature but increases to about a 4-5 out of 10 in nature when she swallows or tries to eat food or water.  She does have a scant cough but thinks this is more of clearing her throat due to discomfort.  She denies any fevers, shortness of breath, nasal drainage.  Patient works at a daycare and states that someone there is positive for strep throat but she denies any close interactions with them.  No known Covid contacts.  She reports that she did take an at home Covid test today which was negative.   Past Medical History:  Diagnosis Date  . Family history of breast cancer   . Family history of breast cancer     Patient Active Problem List   Diagnosis Date Noted  . Genetic testing 08/19/2018  . Family history of breast cancer   . At high risk for breast cancer 08/03/2018    Past Surgical History:  Procedure Laterality Date  . CHOLECYSTECTOMY      OB History   No obstetric history on file.      Home Medications    Prior to Admission medications   Medication Sig Start Date End Date Taking? Authorizing Provider  albuterol (PROVENTIL HFA;VENTOLIN HFA) 108 (90 Base) MCG/ACT inhaler Inhale 2 puffs into the lungs every 4 (four) hours as needed for wheezing or shortness of breath. 05/18/18  Yes Triplett, Cari B, FNP  hydrOXYzine (ATARAX/VISTARIL) 25 MG tablet Take 25 mg by mouth 3 (three) times daily as needed.   Yes [provider]  sertraline (ZOLOFT) 50 MG tablet Take 50 mg by mouth daily.   Yes [provider]  traZODone (DESYREL) 50 MG tablet Take 25-50 mg by mouth at bedtime.   Yes [provider]  ibuprofen (ADVIL,MOTRIN) 600 MG tablet Take 1 tablet (600 mg total) by mouth every 8 (eight) hours as needed. 11/10/16   Joni Reining, PA-C  magnesium oxide (MAG-OX) 400 MG tablet Take 400 mg by mouth daily.    [provider]  naproxen (NAPROSYN) 500 MG tablet Take 1 tablet (500 mg total) by mouth 2 (two) times daily with a meal. 05/06/19   Joni Reining, PA-C  vitamin B-12 (CYANOCOBALAMIN) 1000 MCG tablet Take 1,000 mcg by mouth daily.    [provider]    Family History Family History  Problem Relation Age of Onset  . Breast cancer Mother 37  . Breast cancer Maternal Aunt        2 maternal great aunts    Social History Social History   Tobacco Use  . Smoking status: Current Every Day Smoker    Packs/day: 0.50  . Smokeless tobacco: Never Used  Vaping Use  . Vaping Use: Never used  Substance Use Topics  . Alcohol use: Yes  . Drug use: No     Allergies   Cephalosporins, Cetirizine, Erythromycin, Sulfa antibiotics, Amoxicillin, and Clindamycin   Review of Systems Review of Systems  As stated above in HPI Physical Exam Triage Vital Signs  ED Triage Vitals  Enc Vitals Group     BP 08/10/20 1055 124/61     Pulse Rate 08/10/20 1055 76     Resp 08/10/20 1055 18     Temp 08/10/20 1055 98.3 F (36.8 C)     Temp Source 08/10/20 1055 Oral     SpO2 08/10/20 1055 100 %     Weight 08/10/20 1051 235 lb (106.6 kg)     Height 08/10/20 1051 5\' 3"  (1.6 m)     Head Circumference --      Peak Flow --      Pain Score 08/10/20 1050 4     Pain Loc --      Pain Edu? --      Excl. in [place]? --    No data found.  Updated Vital Signs BP 124/61 (BP Location: Left Arm)   Pulse 76   Temp 98.3 F (36.8 C) (Oral)   Resp 18   Ht 5\' 3"  (1.6 m)   Wt 235 lb (106.6 kg)   SpO2 100%   BMI 41.63 kg/m   Physical Exam Vitals and nursing note reviewed.  Constitutional:      General: She is not in acute distress.    Appearance: She is well-developed.  She is not ill-appearing, toxic-appearing or diaphoretic.  HENT:     Head: Normocephalic.     Right Ear: No tenderness. A middle ear effusion (mild clear) is present. Tympanic membrane is not erythematous.     Left Ear: No tenderness. A middle ear effusion (mild clear) is present. Tympanic membrane is not erythematous.     Nose: No congestion or rhinorrhea.     Mouth/Throat:     Mouth: Mucous membranes are moist. No oral lesions.     Pharynx: Uvula midline. Oropharyngeal exudate and posterior oropharyngeal erythema present. No pharyngeal swelling or uvula swelling.     Tonsils: No tonsillar abscesses.  Eyes:     Conjunctiva/sclera: Conjunctivae normal.     Pupils: Pupils are equal, round, and reactive to light.  Cardiovascular:     Rate and Rhythm: Normal rate and regular rhythm.     Heart sounds: Normal heart sounds.  Pulmonary:     Effort: Pulmonary effort is normal.     Breath sounds: Normal breath sounds.  Musculoskeletal:     Cervical back: Normal range of motion and neck supple.  Lymphadenopathy:     Cervical: No cervical adenopathy.  Skin:    Findings: No rash.  Neurological:     Mental Status: She is alert.      UC Treatments / Results  Labs (all labs ordered are listed, but only abnormal results are displayed) Labs Reviewed  CULTURE, GROUP A STREP ([facility])  GROUP A STREP BY PCR  SARS CORONAVIRUS 2 (TAT 6-24 HRS)    EKG   Radiology No results found.  Procedures Procedures (including critical care time)  Medications Ordered in UC Medications - No data to display  Initial Impression / Assessment and Plan / UC Course  I have reviewed the triage vital signs and the nursing notes.  Pertinent labs & imaging results that were available during my care of the patient were reviewed by me and considered in my medical decision making (see chart for details).     New.  Her exudate could also very well be at small forming tonsil lith so we are going to test her for  strep pharyngitis as well as a Covid swab given the current pandemic.  In the meantime I recommended she use ibuprofen or Tylenol along with rest, hydration and fluids.  If all of her tests are negative and she is afebrile she can return to work with a mask. Red flags discussed with patient.  Final Clinical Impressions(s) / UC Diagnoses   Final diagnoses:  Pharyngitis, unspecified etiology   Discharge Instructions   None    ED Prescriptions    None     PDMP not reviewed this encounter.   Rushie Chestnut, [place] 08/10/20 1129

## 2020-08-13 LAB — CULTURE, GROUP A STREP (THRC)

## 2020-09-26 ENCOUNTER — Ambulatory Visit
Admission: RE | Admit: 2020-09-26 | Discharge: 2020-09-26 | Disposition: A | Discharge status: Home / Self Care | Payer: Self-pay | Source: Ambulatory Visit | Attending: Family Medicine | Admitting: Family Medicine

## 2020-09-26 ENCOUNTER — Other Ambulatory Visit: Payer: Self-pay

## 2020-09-26 VITALS — BP 125/70 | HR 70 | Temp 98.4°F | Resp 18 | Ht 63.0 in | Wt 235.0 lb

## 2020-09-26 DIAGNOSIS — J069 Acute upper respiratory infection, unspecified: Secondary | ICD-10-CM

## 2020-09-26 DIAGNOSIS — H66003 Acute suppurative otitis media without spontaneous rupture of ear drum, bilateral: Secondary | ICD-10-CM

## 2020-09-26 MED ORDER — IPRATROPIUM BROMIDE 0.06 % NA SOLN: 2.0000 | Freq: Four times a day (QID) | NASAL | 12 refills | Status: DC

## 2020-09-26 MED ORDER — DOXYCYCLINE HYCLATE 100 MG PO CAPS: 100.0000 mg | ORAL_CAPSULE | Freq: Two times a day (BID) | ORAL | 0 refills | Status: DC

## 2020-09-26 MED ORDER — PROMETHAZINE-DM 6.25-15 MG/5ML PO SYRP: 5.0000 mL | ORAL_SOLUTION | Freq: Four times a day (QID) | ORAL | 0 refills | Status: DC | PRN

## 2020-09-26 MED ORDER — BENZONATATE 100 MG PO CAPS: 200.0000 mg | ORAL_CAPSULE | Freq: Three times a day (TID) | ORAL | 0 refills | Status: DC

## 2020-09-26 NOTE — ED Provider Notes (Signed)
[facility]    CSN: [id number] Arrival date & time: 09/26/20  0940      History   Chief Complaint Chief Complaint  Patient presents with  . Otalgia    Left>right  . Cough  . Nasal Congestion    HPI Diane Myers is a 30 y.o. female.   HPI   30 year old female here for evaluation of multiple complaints.  Patient reports that she has been dealing with nasal congestion for the last 3 weeks as well as intermittent runny nose.  1 week ago she developed a cough that is intermittently productive.  Patient reports that now she developed pain in both ears last night with bloody drainage from the left ear.  Patient states that she does have sinus pressure and a green nasal discharge.  Patient also complains of some mild shortness of breath with activity.  Patient denies fever, wheezes, or GI complaints.  Past Medical History:  Diagnosis Date  . Family history of breast cancer   . Family history of breast cancer     Patient Active Problem List   Diagnosis Date Noted  . Genetic testing 08/19/2018  . Family history of breast cancer   . At high risk for breast cancer 08/03/2018    Past Surgical History:  Procedure Laterality Date  . CHOLECYSTECTOMY      OB History   No obstetric history on file.      Home Medications    Prior to Admission medications   Medication Sig Start Date End Date Taking? Authorizing Provider  benzonatate (TESSALON) 100 MG capsule Take 2 capsules (200 mg total) by mouth every 8 (eight) hours. 09/26/20  Yes Margarette Canada, NP  doxycycline (VIBRAMYCIN) 100 MG capsule Take 1 capsule (100 mg total) by mouth 2 (two) times daily. 09/26/20  Yes Margarette Canada, NP  hydrOXYzine (ATARAX/VISTARIL) 25 MG tablet Take 25 mg by mouth 3 (three) times daily as needed.   Yes [provider]  ipratropium (ATROVENT) 0.06 % nasal spray Place 2 sprays into both nostrils 4 (four) times daily. 09/26/20  Yes Margarette Canada, NP  promethazine-dextromethorphan  (PROMETHAZINE-DM) 6.25-15 MG/5ML syrup Take 5 mLs by mouth 4 (four) times daily as needed. 09/26/20  Yes Margarette Canada, NP  sertraline (ZOLOFT) 50 MG tablet Take 50 mg by mouth daily.   Yes [provider]  traZODone (DESYREL) 50 MG tablet Take 25-50 mg by mouth at bedtime.   Yes [provider]  albuterol (PROVENTIL HFA;VENTOLIN HFA) 108 (90 Base) MCG/ACT inhaler Inhale 2 puffs into the lungs every 4 (four) hours as needed for wheezing or shortness of breath. 05/18/18   Victorino Dike, FNP    Family History Family History  Problem Relation Age of Onset  . Breast cancer Mother 61  . Breast cancer Maternal Aunt        2 maternal great aunts    Social History Social History   Tobacco Use  . Smoking status: Current Every Day Smoker    Packs/day: 0.50  . Smokeless tobacco: Never Used  Vaping Use  . Vaping Use: Never used  Substance Use Topics  . Alcohol use: Yes  . Drug use: No     Allergies   Cephalosporins, Cetirizine, Erythromycin, Sulfa antibiotics, Amoxicillin, and Clindamycin   Review of Systems Review of Systems  Constitutional: Negative for activity change, appetite change and fever.  HENT: Positive for congestion, ear discharge, ear pain, rhinorrhea, sinus pressure, sinus pain and sore throat.   Respiratory: Positive  for cough and shortness of breath. Negative for wheezing.   Gastrointestinal: Negative for diarrhea, nausea and vomiting.  Skin: Negative for rash.  Hematological: Negative.   Psychiatric/Behavioral: Negative.      Physical Exam Triage Vital Signs ED Triage Vitals  Enc Vitals Group     BP 09/26/20 0955 125/70     Pulse Rate 09/26/20 0955 70     Resp 09/26/20 0955 18     Temp 09/26/20 0955 98.4 F (36.9 C)     Temp Source 09/26/20 0955 Oral     SpO2 09/26/20 0955 100 %     Weight 09/26/20 0953 235 lb (106.6 kg)     Height 09/26/20 0953 '5\' 3"'  (1.6 m)     Head Circumference --      Peak Flow --      Pain Score 09/26/20 0953 6      Pain Loc --      Pain Edu? --      Excl. in [place]? --    No data found.  Updated Vital Signs BP 125/70 (BP Location: Left Arm)   Pulse 70   Temp 98.4 F (36.9 C) (Oral)   Resp 18   Ht '5\' 3"'  (1.6 m)   Wt 235 lb (106.6 kg)   SpO2 100%   BMI 41.63 kg/m   Visual Acuity Right Eye Distance:   Left Eye Distance:   Bilateral Distance:    Right Eye Near:   Left Eye Near:    Bilateral Near:     Physical Exam Vitals and nursing note reviewed.  Constitutional:      General: She is not in acute distress.    Appearance: Normal appearance. She is not ill-appearing.  HENT:     Head: Normocephalic and atraumatic.     Right Ear: Ear canal and external ear normal.     Left Ear: Ear canal and external ear normal.     Nose: Congestion and rhinorrhea present.     Mouth/Throat:     Mouth: Mucous membranes are moist.     Pharynx: Oropharynx is clear.  Cardiovascular:     Rate and Rhythm: Normal rate.     Pulses: Normal pulses.     Heart sounds: Normal heart sounds. No murmur heard. No gallop.   Pulmonary:     Effort: Pulmonary effort is normal.     Breath sounds: Normal breath sounds. No wheezing, rhonchi or rales.  Musculoskeletal:     Cervical back: Normal range of motion.  Skin:    General: Skin is warm and dry.     Capillary Refill: Capillary refill takes less than 2 seconds.     Findings: No erythema or rash.  Neurological:     General: No focal deficit present.     Mental Status: She is alert and oriented to person, place, and time.  Psychiatric:        Mood and Affect: Mood normal.        Behavior: Behavior normal.        Thought Content: Thought content normal.        Judgment: Judgment normal.      UC Treatments / Results  Labs (all labs ordered are listed, but only abnormal results are displayed) Labs Reviewed - No data to display  EKG   Radiology No results found.  Procedures Procedures (including critical care time)  Medications Ordered in  UC Medications - No data to display  Initial Impression / Assessment and Plan /  UC Course  I have reviewed the triage vital signs and the nursing notes.  Pertinent labs & imaging results that were available during my care of the patient were reviewed by me and considered in my medical decision making (see chart for details).   Patient is a very pleasant 30 year old female here for evaluation of respiratory issues that been going on for the past 3 weeks.  She started with nasal congestion 3 weeks ago, developed a cough 1 week ago, and developed bilateral ear pain last night with what she terms as bloody discharge from the left ear.  Physical exam reveals bilateral erythematous and injected tympanic membranes.  No perforation is visualized.  External auditory canals are clear.  Nasal mucosa is erythematous edematous with yellow nasal discharge.  Patient does have tenderness to percussion of her frontal and maxillary sinuses.  Posterior oropharynx shows no tonsillar involvement but does have posterior oropharyngeal erythema with yellow postnasal drip.  No cervical lymphadenopathy appreciated.  Lung sounds clear to auscultation.  Patient's exam is consistent with a URI and bilateral otitis media.  Patient is allergic to cephalosporins, erythromycin, clindamycin, and penicillins.  We will treat patient with doxycycline 100 mg twice daily x10 days, Atrovent nasal spray, Tessalon Perles and Promethazine DM for cough and congestion.  Patient also advised to perform sinus irrigation to help alleviate sinus congestion.     Final Clinical Impressions(s) / UC Diagnoses   Final diagnoses:  Upper respiratory tract infection, unspecified type  Non-recurrent acute suppurative otitis media of both ears without spontaneous rupture of tympanic membranes     Discharge Instructions     The Doxycycline twice daily with food for 10 days for treatment of your sinusitis.  Use the Atrovent nasal spray, 2 squirts each  nostril 4 times a day, as needed for nasal congestion and runny nose.  Use the Tessalon Perles during the day and the Promethazine DM cough syrup at bedtime as the cough syrup will make you drowsy.  Perform sinus irrigation 2-3 times a day with a [employer] sinus rinse kit and distilled water.  Do not use tap water.  You can use plain over-the-counter Mucinex every 6 hours to break up the stickiness of the mucus so your body can clear it.  Increase your oral fluid intake to thin out your mucus so that is also able for your body to clear more easily.  Take an over-the-counter probiotic, such as Culturelle-align-activia, 1 hour after each dose of antibiotic to prevent diarrhea.  If you develop any new or worsening symptoms return for reevaluation or see your primary care provider.     ED Prescriptions    Medication Sig Dispense Auth. Provider   doxycycline (VIBRAMYCIN) 100 MG capsule Take 1 capsule (100 mg total) by mouth 2 (two) times daily. 20 capsule Margarette Canada, NP   ipratropium (ATROVENT) 0.06 % nasal spray Place 2 sprays into both nostrils 4 (four) times daily. 15 mL Margarette Canada, NP   benzonatate (TESSALON) 100 MG capsule Take 2 capsules (200 mg total) by mouth every 8 (eight) hours. 21 capsule Margarette Canada, NP   promethazine-dextromethorphan (PROMETHAZINE-DM) 6.25-15 MG/5ML syrup Take 5 mLs by mouth 4 (four) times daily as needed. 118 mL Margarette Canada, NP     PDMP not reviewed this encounter.   Margarette Canada, NP 09/26/20 1157

## 2020-09-26 NOTE — ED Triage Notes (Signed)
Pt c/o nasal congestion for about 3 weeks. Pt reports chest congestion and cough starting about a week ago. Pt also reports pain to her left ear, she had a cotton ball in it overnight and in the morning it had blood on it. She now also has pain in the right ear, no drainage. Pt denies f/n/v/d or other symptoms.

## 2020-09-26 NOTE — Discharge Instructions (Addendum)
The Doxycycline twice daily with food for 10 days for treatment of your sinusitis.  Use the Atrovent nasal spray, 2 squirts each nostril 4 times a day, as needed for nasal congestion and runny nose.  Use the Tessalon Perles during the day and the Promethazine DM cough syrup at bedtime as the cough syrup will make you drowsy.  Perform sinus irrigation 2-3 times a day with a [employer] sinus rinse kit and distilled water.  Do not use tap water.  You can use plain over-the-counter Mucinex every 6 hours to break up the stickiness of the mucus so your body can clear it.  Increase your oral fluid intake to thin out your mucus so that is also able for your body to clear more easily.  Take an over-the-counter probiotic, such as Culturelle-align-activia, 1 hour after each dose of antibiotic to prevent diarrhea.  If you develop any new or worsening symptoms return for reevaluation or see your primary care provider.

## 2022-03-21 ENCOUNTER — Emergency Department
Admission: EM | Admit: 2022-03-21 | Discharge: 2022-03-21 | Disposition: A | Discharge status: Home / Self Care | Payer: BC Managed Care – PPO | Attending: Emergency Medicine | Admitting: Emergency Medicine

## 2022-03-21 ENCOUNTER — Encounter: Payer: Self-pay | Admitting: Emergency Medicine

## 2022-03-21 DIAGNOSIS — T368X5A Adverse effect of other systemic antibiotics, initial encounter: Secondary | ICD-10-CM | POA: Insufficient documentation

## 2022-03-21 DIAGNOSIS — T7840XA Allergy, unspecified, initial encounter: Secondary | ICD-10-CM

## 2022-03-21 DIAGNOSIS — L500 Allergic urticaria: Secondary | ICD-10-CM | POA: Insufficient documentation

## 2022-03-21 MED ORDER — DIPHENHYDRAMINE HCL 25 MG PO CAPS
50.0000 mg | ORAL_CAPSULE | Freq: Once | ORAL | Status: AC
  Administered 2022-03-21: 50 mg via ORAL
  Filled 2022-03-21: qty 2

## 2022-03-21 MED ORDER — PREDNISONE 20 MG PO TABS
60.0000 mg | ORAL_TABLET | Freq: Once | ORAL | Status: AC
  Administered 2022-03-21: 60 mg via ORAL
  Filled 2022-03-21: qty 3

## 2022-03-21 MED ORDER — PREDNISONE 10 MG (21) PO TBPK: ORAL_TABLET | ORAL | 0 refills | Status: DC

## 2022-03-21 NOTE — ED Triage Notes (Addendum)
Pt presents via POV with complaints of allergic reaction to clindamycin. Pt states she is taking the medication for a dental infection. She notes taking the medication around 1800 yesterday and she woke up around midnight with a rash on her left wrist and behind both ears. No meds taken PTA. Airway patent - respirations equal and unlabored.

## 2022-03-21 NOTE — ED Provider Notes (Signed)
[facility] Provider Note    Event Date/Time   First MD Initiated Contact with Patient 03/21/22 [phone]     (approximate)   History   Allergic Reaction   HPI  Diane Myers is a 31 y.o. female with no significant past medical history who presents to the emergency department with diffuse urticaria that started after taking clindamycin.  States she was started on clindamycin by her dentist as she needs to have a root canal but they wanted to "clear up the infection" first.  She states that she could not remember if she was allergic to clindamycin or not and thought it was doxycycline she was allergic to.  She denies any other exposures.  She did not have any medications at home to take and has already received Benadryl in triage and states it only "took the edge off".  No lip or tongue swelling, difficulty breathing, swallowing, speaking.  No hypotension.   History provided by patient.    Past Medical History:  Diagnosis Date   Family history of breast cancer    Family history of breast cancer     Past Surgical History:  Procedure Laterality Date   CHOLECYSTECTOMY      MEDICATIONS:  Prior to Admission medications   Medication Sig Start Date End Date Taking? Authorizing Provider  predniSONE (STERAPRED UNI-PAK 21 TAB) 10 MG (21) TBPK tablet Take as directed.  Please start 03/22/22. 03/21/22  Yes Chiyoko Torrico, Layla Maw, DO  albuterol (PROVENTIL HFA;VENTOLIN HFA) 108 (90 Base) MCG/ACT inhaler Inhale 2 puffs into the lungs every 4 (four) hours as needed for wheezing or shortness of breath. 05/18/18   Triplett, Cari B, FNP  benzonatate (TESSALON) 100 MG capsule Take 2 capsules (200 mg total) by mouth every 8 (eight) hours. 09/26/20   Becky Augusta, NP  doxycycline (VIBRAMYCIN) 100 MG capsule Take 1 capsule (100 mg total) by mouth 2 (two) times daily. 09/26/20   Becky Augusta, NP  hydrOXYzine (ATARAX/VISTARIL) 25 MG tablet Take 25 mg by mouth 3 (three) times daily as  needed.    [provider]  ipratropium (ATROVENT) 0.06 % nasal spray Place 2 sprays into both nostrils 4 (four) times daily. 09/26/20   Becky Augusta, NP  promethazine-dextromethorphan (PROMETHAZINE-DM) 6.25-15 MG/5ML syrup Take 5 mLs by mouth 4 (four) times daily as needed. 09/26/20   Becky Augusta, NP  sertraline (ZOLOFT) 50 MG tablet Take 50 mg by mouth daily.    [provider]  traZODone (DESYREL) 50 MG tablet Take 25-50 mg by mouth at bedtime.    [provider]    Physical Exam   Triage Vital Signs: ED Triage Vitals  Enc Vitals Group     BP 03/21/22 0234 (!) 146/87     Pulse Rate 03/21/22 0234 100     Resp 03/21/22 0234 18     Temp 03/21/22 0234 98.1 F (36.7 C)     Temp Source 03/21/22 0234 Oral     SpO2 03/21/22 0234 98 %     Weight 03/21/22 0234 225 lb (102.1 kg)     Height 03/21/22 0234 5\' 3"  (1.6 m)     Head Circumference --      Peak Flow --      Pain Score 03/21/22 0235 4     Pain Loc --      Pain Edu? --      Excl. in [place]? --     Most recent vital signs: Vitals:   03/21/22 0234  03/21/22 0651  BP: (!) 146/87 133/80  Pulse: 100 89  Resp: 18 18  Temp: 98.1 F (36.7 C) 98 F (36.7 C)  SpO2: 98% 98%    CONSTITUTIONAL: Alert and oriented and responds appropriately to questions. Well-appearing; well-nourished HEAD: Normocephalic, atraumatic EYES: Conjunctivae clear, pupils appear equal, sclera nonicteric ENT: normal nose; moist mucous membranes, normal phonation, no trismus, no drooling, no stridor, no angioedema.  Patient does have urticaria to her face but no facial swelling.  Tongue sits flat in the bottom of the mouth.  There is no sign of Ludwig's. NECK: Supple, normal ROM CARD: RRR; S1 and S2 appreciated; no murmurs, no clicks, no rubs, no gallops RESP: Normal chest excursion without splinting or tachypnea; breath sounds clear and equal bilaterally; no wheezes, no rhonchi, no rales, no hypoxia or respiratory distress, speaking full  sentences ABD/GI: Normal bowel sounds; non-distended; soft, non-tender, no rebound, no guarding, no peritoneal signs BACK: The back appears normal EXT: Normal ROM in all joints; no deformity noted, no edema; no cyanosis SKIN: Normal color for age and race; warm; diffuse urticaria NEURO: Moves all extremities equally, normal speech PSYCH: The patient's mood and manner are appropriate.   ED Results / Procedures / Treatments   LABS: (all labs ordered are listed, but only abnormal results are displayed) Labs Reviewed - No data to display   EKG:     RADIOLOGY: My personal review and interpretation of imaging:    I have personally reviewed all radiology reports.   No results found.   PROCEDURES:  Critical Care performed: No      Procedures    IMPRESSION / MDM / ASSESSMENT AND PLAN / ED COURSE  I reviewed the triage vital signs and the nursing notes.    Patient here after an allergic reaction to clindamycin.  No airway involvement, hypotension.    DIFFERENTIAL DIAGNOSIS (includes but not limited to):   Allergic reaction.  No signs clinically of anaphylaxis.  Doubt TEN, SJS, infectious etiology.   Patient's presentation is most consistent with acute presentation with potential threat to life or bodily function.   PLAN: Given Benadryl in triage without much relief.  We will start her on prednisone taper as well.  Have advised her to avoid clindamycin.  Recommended that she follow-up closely with her dentist for further recommendations for what antibiotic they would like for her to be on given she is allergic to cephalosporins, sulfa antibiotics, penicillins and clindamycin.  She has no sign of facial swelling, facial cellulitis, Ludwig's on exam and I do not feel her antibiotic needs to be changed emergently.  She is not in anaphylaxis today.  There is no indication for epinephrine.   MEDICATIONS GIVEN IN ED: Medications  diphenhydrAMINE (BENADRYL) capsule 50 mg  (50 mg Oral Given 03/21/22 0247)  predniSONE (DELTASONE) tablet 60 mg (60 mg Oral Given 03/21/22 0649)     ED COURSE:  At this time, I do not feel there is any life-threatening condition present. I reviewed all nursing notes, vitals, pertinent previous records.  All lab and urine results, EKGs, imaging ordered have been independently reviewed and interpreted by myself.  I reviewed all available radiology reports from any imaging ordered this visit.  Based on my assessment, I feel the patient is safe to be discharged home without further emergent workup and can continue workup as an outpatient as needed. Discussed all findings, treatment plan as well as usual and customary return precautions.  They verbalize understanding and are comfortable with  this plan.  Outpatient follow-up has been provided as needed.  All questions have been answered.    CONSULTS: No admission required at this time for uncomplicated allergic reaction to clindamycin.   OUTSIDE RECORDS REVIEWED: Reviewed patient's last office visit with Dr. Orlie Dakin with oncology on 08/05/2018.       FINAL CLINICAL IMPRESSION(S) / ED DIAGNOSES   Final diagnoses:  Allergic reaction, initial encounter     Rx / DC Orders   ED Discharge Orders          Ordered    predniSONE (STERAPRED UNI-PAK 21 TAB) 10 MG (21) TBPK tablet        03/21/22 7824             Note:  This document was prepared using Dragon voice recognition software and may include unintentional dictation errors.   Julie Nay, Layla Maw, DO 03/21/22 [phone]

## 2022-03-21 NOTE — Discharge Instructions (Addendum)
Please stop clindamycin.  Please follow-up with your dentist for further recommendations for what antibiotics you should take.  May continue over-the-counter Benadryl 50 mg every 6 hours as needed for itching, rash.

## 2022-03-21 NOTE — ED Notes (Signed)
E-signature pad unavailable - Pt verbalized understanding of D/C information - no additional concerns at this time.  

## 2022-06-13 ENCOUNTER — Other Ambulatory Visit: Payer: Self-pay

## 2022-06-13 ENCOUNTER — Ambulatory Visit
Admission: EM | Admit: 2022-06-13 | Discharge: 2022-06-13 | Disposition: A | Discharge status: Home / Self Care | Payer: BC Managed Care – PPO | Attending: Family | Admitting: Family

## 2022-06-13 DIAGNOSIS — U071 COVID-19: Secondary | ICD-10-CM | POA: Insufficient documentation

## 2022-06-13 DIAGNOSIS — J029 Acute pharyngitis, unspecified: Secondary | ICD-10-CM | POA: Diagnosis present

## 2022-06-13 DIAGNOSIS — Z8709 Personal history of other diseases of the respiratory system: Secondary | ICD-10-CM | POA: Insufficient documentation

## 2022-06-13 HISTORY — DX: Anxiety disorder, unspecified: F41.9

## 2022-06-13 LAB — BASIC METABOLIC PANEL
Anion gap: 8 (ref 5–15)
BUN: 10 mg/dL (ref 6–20)
CO2: 23 mmol/L (ref 22–32)
Calcium: 8.6 mg/dL — ABNORMAL LOW (ref 8.9–10.3)
Chloride: 103 mmol/L (ref 98–111)
Creatinine, Ser: 0.57 mg/dL (ref 0.44–1.00)
GFR, Estimated: >60 mL/min (ref >60)
Glucose, Bld: 99 mg/dL (ref 70–99)
Potassium: 4 mmol/L (ref 3.5–5.1)
Sodium: 134 mmol/L — ABNORMAL LOW (ref 135–145)

## 2022-06-13 LAB — GROUP A STREP BY PCR: Group A Strep by PCR: NOT DETECTED

## 2022-06-13 LAB — SARS CORONAVIRUS 2 BY RT PCR: SARS Coronavirus 2 by RT PCR: POSITIVE — AB

## 2022-06-13 MED ORDER — NIRMATRELVIR/RITONAVIR (PAXLOVID)TABLET: 3.0000 | ORAL_TABLET | Freq: Two times a day (BID) | ORAL | 0 refills | Status: AC

## 2022-06-13 NOTE — ED Provider Notes (Signed)
[facility]    CSN: [id number] Arrival date & time: 06/13/22  0805      History   Chief Complaint Chief Complaint  Patient presents with   Sore Throat   Cough    HPI Diane Myers is a 32 y.o. female.   32 year old female presents with sore throat that started yesterday and development of a low grade fever today. First started with some nasal congestion and cough about 10 days ago. Was starting to improve about 4 to 5 days ago but yesterday developed a bad sore throat. Today now feels worse with body aches. No GI symptoms. Has taken Tylenol and OTC cold and Flu medication with some relief. Took a home COVID test last week which was negative. Other family members also sick with similar symptoms. Has multiple allergies to antibiotics. Has history of asthma and frequent bronchitis. Has used Albuterol inhaler in the past. Former smoker. No other current medications. Nexplanon implant has expired- waiting to get it removed.   The history is provided by the patient.    Past Medical History:  Diagnosis Date   Anxiety    Family history of breast cancer    Family history of breast cancer     Patient Active Problem List   Diagnosis Date Noted   Genetic testing 08/19/2018   Family history of breast cancer    At high risk for breast cancer 08/03/2018    Past Surgical History:  Procedure Laterality Date   CHOLECYSTECTOMY      OB History   No obstetric history on file.      Home Medications    Prior to Admission medications   Medication Sig Start Date End Date Taking? Authorizing Provider  Etonogestrel (NEXPLANON [place]) Inject into the skin.   Yes [provider]  nirmatrelvir/ritonavir (PAXLOVID) 20 x 150 MG & 10 x 100MG  TABS Take 3 tablets by mouth 2 (two) times daily for 5 days. Patient GFR is >60. Take nirmatrelvir (150 mg) two tablets twice daily for 5 days and ritonavir (100 mg) one tablet twice daily for 5 days. 06/13/22 06/18/22 Yes Shaylynne Lunt, Nicholes Stairs, NP   albuterol (PROVENTIL HFA;VENTOLIN HFA) 108 (90 Base) MCG/ACT inhaler Inhale 2 puffs into the lungs every 4 (four) hours as needed for wheezing or shortness of breath. 05/18/18   Victorino Dike, FNP    Family History Family History  Problem Relation Age of Onset   Breast cancer Mother 71   Breast cancer Maternal Aunt        2 maternal great aunts    Social History Social History   Tobacco Use   Smoking status: Former    Packs/day: 0.50    Types: Cigarettes    Quit date: 06/14/2019    Years since quitting: 3.0   Smokeless tobacco: Never  Vaping Use   Vaping Use: Former  Substance Use Topics   Alcohol use: Yes    Comment: social   Drug use: No     Allergies   Cephalosporins, Cetirizine, Doxycycline, Erythromycin, Sulfa antibiotics, Amoxicillin, and Clindamycin   Review of Systems Review of Systems  Constitutional:  Positive for activity change, fatigue and fever. Negative for appetite change and diaphoresis.  Eyes:  Negative for discharge, redness and itching.  Gastrointestinal:  Negative for nausea and vomiting.  Musculoskeletal:  Positive for arthralgias and myalgias. Negative for neck pain and neck stiffness.  Skin:  Negative for color change and rash.  Allergic/Immunologic: Negative for environmental allergies and  food allergies.  Neurological:  Positive for headaches. Negative for dizziness, tremors, seizures, syncope, speech difficulty and numbness.  Hematological:  Negative for adenopathy. Does not bruise/bleed easily.     Physical Exam Triage Vital Signs ED Triage Vitals  Enc Vitals Group     BP 06/13/22 0909 118/63     Pulse Rate 06/13/22 0909 70     Resp 06/13/22 0909 16     Temp 06/13/22 0909 98.2 F (36.8 C)     Temp Source 06/13/22 0909 Oral     SpO2 06/13/22 0909 100 %     Weight 06/13/22 0904 230 lb (104.3 kg)     Height 06/13/22 0904 5\' 3"  (1.6 m)     Head Circumference --      Peak Flow --      Pain Score 06/13/22 0904 3     Pain Loc --       Pain Edu? --      Excl. in [place]? --    No data found.  Updated Vital Signs BP 118/63 (BP Location: Left Arm)   Pulse 70   Temp 98.2 F (36.8 C) (Oral)   Resp 16   Ht 5\' 3"  (1.6 m)   Wt 230 lb (104.3 kg)   LMP 06/05/2022   SpO2 100%   BMI 40.74 kg/m   Visual Acuity Right Eye Distance:   Left Eye Distance:   Bilateral Distance:    Right Eye Near:   Left Eye Near:    Bilateral Near:     Physical Exam Vitals and nursing note reviewed.  Constitutional:      General: She is awake. She is not in acute distress.    Appearance: She is well-developed and well-groomed. She is ill-appearing.     Comments: She is sitting on the exam table in no acute distress but appears tired and ill.   HENT:     Head: Normocephalic and atraumatic.     Right Ear: Hearing, tympanic membrane, ear canal and external ear normal.     Left Ear: Hearing, tympanic membrane, ear canal and external ear normal.     Nose: Congestion present.     Right Sinus: No maxillary sinus tenderness or frontal sinus tenderness.     Left Sinus: No maxillary sinus tenderness or frontal sinus tenderness.     Mouth/Throat:     Lips: Pink.     Mouth: Mucous membranes are moist.     Pharynx: Uvula midline. Posterior oropharyngeal erythema present. No pharyngeal swelling, oropharyngeal exudate or uvula swelling.  Eyes:     Extraocular Movements: Extraocular movements intact.     Conjunctiva/sclera: Conjunctivae normal.  Cardiovascular:     Rate and Rhythm: Normal rate and regular rhythm.     Heart sounds: Normal heart sounds. No murmur heard. Pulmonary:     Effort: Pulmonary effort is normal. No respiratory distress.     Breath sounds: Normal breath sounds and air entry. No decreased air movement. No decreased breath sounds, wheezing, rhonchi or rales.  Musculoskeletal:     Cervical back: Normal range of motion and neck supple.  Lymphadenopathy:     Cervical: No cervical adenopathy.  Skin:    General: Skin is warm  and dry.     Capillary Refill: Capillary refill takes less than 2 seconds.     Findings: No rash.  Neurological:     General: No focal deficit present.     Mental Status: She is alert and oriented to person, place,  and time.  Psychiatric:        Mood and Affect: Mood normal.        Behavior: Behavior normal. Behavior is cooperative.        Thought Content: Thought content normal.        Judgment: Judgment normal.      UC Treatments / Results  Labs (all labs ordered are listed, but only abnormal results are displayed) Labs Reviewed  SARS CORONAVIRUS 2 BY RT PCR - Abnormal; Notable for the following components:      Result Value   SARS Coronavirus 2 by RT PCR POSITIVE (*)    All other components within normal limits  BASIC METABOLIC PANEL - Abnormal; Notable for the following components:   Sodium 134 (*)    Calcium 8.6 (*)    All other components within normal limits  GROUP A STREP BY PCR    EKG   Radiology No results found.  Procedures Procedures (including critical care time)  Medications Ordered in UC Medications - No data to display  Initial Impression / Assessment and Plan / UC Course  I have reviewed the triage vital signs and the nursing notes.  Pertinent labs & imaging results that were available during my care of the patient were reviewed by me and considered in my medical decision making (see chart for details).     Reviewed positive COVID test result with patient and negative rapid strep test. Discussed that COVID 19 is a virus and antibiotics are not indicated. Patient has had COVID twice before. Also has history of asthma- has used Albuterol inhaler in the past. No recent blood work.  Performed Basic Metabolic panel to check kidney function in order to prescribe Paxlovid. Patient's GFR is over 60 with normal BUN and Creatinine. Since patient has history of asthma and frequent bronchitis and is overweight, will prescribe Paxlovid twice a day for 5 days-  reviewed side effects. Continue to push fluids. Rest. May continue Tylenol 1000mg  and alternate with Ibuprofen 600mg  every 6 hours as needed for fever/pain. Note written for work. If any difficulty breathing occurs or any chest pain develops, go to the ER. Otherwise, follow-up in 3 to 4 days if not improving.  Final Clinical Impressions(s) / UC Diagnoses   Final diagnoses:  COVID-19  Sore throat  History of asthma     Discharge Instructions      May take Paxlovid twice a day for 5 days as directed. Continue to push fluids. Rest. May continue Tylenol 1000mg  and alternate with Ibuprofen 600mg  every 6 hours as needed for fever/pain. If any difficulty breathing occurs, return for recheck or go to the ER. Otherwise, follow-up in 3 to 4 days if not improving.     ED Prescriptions     Medication Sig Dispense Auth. Provider   nirmatrelvir/ritonavir (PAXLOVID) 20 x 150 MG & 10 x 100MG  TABS Take 3 tablets by mouth 2 (two) times daily for 5 days. Patient GFR is >60. Take nirmatrelvir (150 mg) two tablets twice daily for 5 days and ritonavir (100 mg) one tablet twice daily for 5 days. 30 tablet Jene Huq, Nicholes Stairs, NP      PDMP not reviewed this encounter.   Katy Apo, NP 06/13/22 [phone]

## 2022-06-13 NOTE — ED Triage Notes (Addendum)
Pt reports last week had nasal congestion, sore throat and cough. Sore throat started to get much worse 2 days ago. Had negative COVID test last week

## 2022-06-13 NOTE — Discharge Instructions (Addendum)
May take Paxlovid twice a day for 5 days as directed. Continue to push fluids. Rest. May continue Tylenol 1000mg  and alternate with Ibuprofen 600mg  every 6 hours as needed for fever/pain. If any difficulty breathing occurs, return for recheck or go to the ER. Otherwise, follow-up in 3 to 4 days if not improving.

## 2022-06-28 ENCOUNTER — Ambulatory Visit
Admission: RE | Admit: 2022-06-28 | Discharge: 2022-06-28 | Disposition: A | Discharge status: Home / Self Care | Payer: BC Managed Care – PPO | Source: Ambulatory Visit | Attending: Physician Assistant | Admitting: Physician Assistant

## 2022-06-28 VITALS — BP 121/73 | HR 69 | Temp 97.9°F | Resp 16 | Ht 63.0 in | Wt 220.0 lb

## 2022-06-28 DIAGNOSIS — R234 Changes in skin texture: Secondary | ICD-10-CM

## 2022-06-28 DIAGNOSIS — Z803 Family history of malignant neoplasm of breast: Secondary | ICD-10-CM

## 2022-06-28 DIAGNOSIS — N6325 Unspecified lump in the left breast, overlapping quadrants: Secondary | ICD-10-CM | POA: Diagnosis not present

## 2022-06-28 NOTE — ED Provider Notes (Signed)
[facility]    CSN: [id number] Arrival date & time: 06/28/22  1742      History   Chief Complaint Chief Complaint  Patient presents with   Breast Problem    Found a lump and have dimpling on side of breast. I can't get in for new patient anywhere. I need a referral for a mammogram. Family history of breast cancer. - Entered by patient    HPI Diane Myers is a 32 y.o. female.   Patient presents today with a several week history of lump in skin and peeling on her left lateral breast.  She has a strong family history of breast cancer in her mother who was diagnosed between age 91 and 33 and died at age 3 related to breast cancer.  She has had mammograms in the past that were normal but has not had one in several years.  She occasionally does self breast exams but has not been regularly doing them.  She does have nipple piercings but these were done several years ago.  Denies any nipple inversion, nipple discharge, fever, unintentional weight loss, widespread pain.  She is confident that she is not pregnant.  She is not breast-feeding.  She does not currently have a primary care and will not be able to establish with someone until June of this year.  She is requesting referral for mammography.    Past Medical History:  Diagnosis Date   Anxiety    Family history of breast cancer    Family history of breast cancer     Patient Active Problem List   Diagnosis Date Noted   Genetic testing 08/19/2018   Family history of breast cancer    At high risk for breast cancer 08/03/2018    Past Surgical History:  Procedure Laterality Date   CHOLECYSTECTOMY      OB History   No obstetric history on file.      Home Medications    Prior to Admission medications   Medication Sig Start Date End Date Taking? Authorizing Provider  Etonogestrel (NEXPLANON [place]) Inject into the skin.   Yes [provider]  albuterol (PROVENTIL HFA;VENTOLIN HFA) 108 (90 Base) MCG/ACT  inhaler Inhale 2 puffs into the lungs every 4 (four) hours as needed for wheezing or shortness of breath. 05/18/18   Victorino Dike, FNP    Family History Family History  Problem Relation Age of Onset   Breast cancer Mother 86   Breast cancer Maternal Aunt        2 maternal great aunts    Social History Social History   Tobacco Use   Smoking status: Former    Packs/day: 0.50    Types: Cigarettes    Quit date: 06/14/2019    Years since quitting: 3.0   Smokeless tobacco: Never  Vaping Use   Vaping Use: Former  Substance Use Topics   Alcohol use: Yes    Comment: social   Drug use: No     Allergies   Cephalosporins, Cetirizine, Doxycycline, Erythromycin, Sulfa antibiotics, Amoxicillin, and Clindamycin   Review of Systems Review of Systems  Constitutional:  Negative for activity change, appetite change, fatigue, fever and unexpected weight change.  Respiratory:  Negative for cough and shortness of breath.   Cardiovascular:  Negative for chest pain.  Gastrointestinal:  Negative for abdominal pain, diarrhea, nausea and vomiting.     Physical Exam Triage Vital Signs ED Triage Vitals  Enc Vitals Group     BP 06/28/22  1748 121/73     Pulse Rate 06/28/22 1748 69     Resp 06/28/22 1748 16     Temp 06/28/22 1748 97.9 F (36.6 C)     Temp Source 06/28/22 1748 Oral     SpO2 06/28/22 1748 99 %     Weight 06/28/22 1746 220 lb (99.8 kg)     Height 06/28/22 1746 5\' 3"  (1.6 m)     Head Circumference --      Peak Flow --      Pain Score 06/28/22 1746 0     Pain Loc --      Pain Edu? --      Excl. in [place]? --    No data found.  Updated Vital Signs BP 121/73 (BP Location: Left Arm)   Pulse 69   Temp 97.9 F (36.6 C) (Oral)   Resp 16   Ht 5\' 3"  (1.6 m)   Wt 220 lb (99.8 kg)   LMP 06/05/2022   SpO2 99%   BMI 38.97 kg/m   Visual Acuity Right Eye Distance:   Left Eye Distance:   Bilateral Distance:    Right Eye Near:   Left Eye Near:    Bilateral Near:      Physical Exam Vitals reviewed.  Constitutional:      General: She is awake. She is not in acute distress.    Appearance: Normal appearance. She is well-developed. She is not ill-appearing.     Comments: Very pleasant female appears stated age in no acute distress sitting comfortably in exam room  HENT:     Head: Normocephalic and atraumatic.  Cardiovascular:     Rate and Rhythm: Normal rate and regular rhythm.     Heart sounds: Normal heart sounds, S1 normal and S2 normal. No murmur heard. Pulmonary:     Effort: Pulmonary effort is normal.     Breath sounds: Normal breath sounds. No wheezing, rhonchi or rales.     Comments: Clear to auscultation bilaterally Chest:     Chest wall: No lacerations, deformity or swelling.  Breasts:    Right: Normal.     Left: Mass and skin change present. No swelling, inverted nipple, nipple discharge or tenderness.       Comments: Nodule noted under area of dimpling without tenderness or erythema. Abdominal:     General: Bowel sounds are normal.     Palpations: Abdomen is soft.     Tenderness: There is no abdominal tenderness. There is no right CVA tenderness, left CVA tenderness, guarding or rebound.  Lymphadenopathy:     Upper Body:     Right upper body: No supraclavicular or axillary adenopathy.     Left upper body: No supraclavicular or axillary adenopathy.  Psychiatric:        Behavior: Behavior is cooperative.      UC Treatments / Results  Labs (all labs ordered are listed, but only abnormal results are displayed) Labs Reviewed - No data to display  EKG   Radiology No results found.  Procedures Procedures (including critical care time)  Medications Ordered in UC Medications - No data to display  Initial Impression / Assessment and Plan / UC Course  I have reviewed the triage vital signs and the nursing notes.  Pertinent labs & imaging results that were available during my care of the patient were reviewed by me and  considered in my medical decision making (see chart for details).     Patient is well-appearing, afebrile, nontoxic, nontachycardic.  Discussed the importance of obtaining imaging.  Stat referral for mammogram and ultrasound was ordered as well as urgent referral placed to [facility] breast center.  Discussed that she should be contacted within a week regarding scheduling of these appointments and if she does not hear from someone in this timeframe she is to contact us so we can check into the status of the referral.  Discussed that if she has any worsening or changing symptoms she needs to be seen emergently.  Strict return precautions given.  Final Clinical Impressions(s) / UC Diagnoses   Final diagnoses:  Breast skin changes  Mass overlapping multiple quadrants of left breast  Family history of breast cancer in first degree relative     Discharge Instructions      I have placed an urgent referral to the [facility] breast center as well as an order for mammogram and ultrasound.  Someone should be contacting you by early next week to arrange this.  If you have any difficulty getting in with them please contact us.  If you have any worsening or changing symptoms you need to be seen immediately.    ED Prescriptions   None    PDMP not reviewed this encounter.   Jeani Hawking, PA-C 06/28/22 1817

## 2022-06-28 NOTE — Discharge Instructions (Addendum)
I have placed an urgent referral to the [facility] breast center as well as an order for mammogram and ultrasound.  Someone should be contacting you by early next week to arrange this.  If you have any difficulty getting in with them please contact us.  If you have any worsening or changing symptoms you need to be seen immediately.

## 2022-06-28 NOTE — ED Triage Notes (Signed)
Found a lump and have dimpling on side of breast. I can't get in for new patient anywhere. I need a referral for a mammogram. Family history of breast cancer. - Entered by patient

## 2022-06-29 ENCOUNTER — Encounter (HOSPITAL_COMMUNITY): Payer: Self-pay

## 2022-07-04 ENCOUNTER — Telehealth: Payer: Self-pay | Admitting: Physician Assistant

## 2022-07-04 ENCOUNTER — Encounter: Payer: Self-pay | Admitting: Physician Assistant

## 2022-07-04 DIAGNOSIS — N6325 Unspecified lump in the left breast, overlapping quadrants: Secondary | ICD-10-CM

## 2022-07-04 DIAGNOSIS — Z803 Family history of malignant neoplasm of breast: Secondary | ICD-10-CM

## 2022-07-04 DIAGNOSIS — R234 Changes in skin texture: Secondary | ICD-10-CM

## 2022-07-04 NOTE — Telephone Encounter (Signed)
Received a message from another provider in clinic that patient returned needing new imaging orders. New orders for requested breast ultrasound ([id number]) and diagnostic mammogram ([id number]) placed with priority STAT.

## 2022-07-09 ENCOUNTER — Ambulatory Visit
Admission: RE | Admit: 2022-07-09 | Discharge: 2022-07-09 | Disposition: A | Discharge status: Home / Self Care | Payer: BC Managed Care – PPO | Source: Ambulatory Visit | Attending: Physician Assistant | Admitting: Physician Assistant

## 2022-07-09 DIAGNOSIS — R234 Changes in skin texture: Secondary | ICD-10-CM

## 2022-07-09 DIAGNOSIS — Z803 Family history of malignant neoplasm of breast: Secondary | ICD-10-CM

## 2022-07-09 DIAGNOSIS — N6325 Unspecified lump in the left breast, overlapping quadrants: Secondary | ICD-10-CM

## 2022-09-10 NOTE — Progress Notes (Unsigned)
   There were no vitals taken for this visit.   Subjective:    Patient ID: Diane Myers, female    DOB: Dec 30, 1990, 32 y.o.   MRN: [id number]  HPI: Diane Myers is a 32 y.o. female  No chief complaint on file.  Establish care: her last physical was ***.  Medical history includes ***.  Family history includes ***.  Health maintenance ***.   Relevant past medical, surgical, family and social history reviewed and updated as indicated. Interim medical history since our last visit reviewed. Allergies and medications reviewed and updated.  Review of Systems  Constitutional: Negative for fever or weight change.  Respiratory: Negative for cough and shortness of breath.   Cardiovascular: Negative for chest pain or palpitations.  Gastrointestinal: Negative for abdominal pain, no bowel changes.  Musculoskeletal: Negative for gait problem or joint swelling.  Skin: Negative for rash.  Neurological: Negative for dizziness or headache.  No other specific complaints in a complete review of systems (except as listed in HPI above).      Objective:    There were no vitals taken for this visit.  Wt Readings from Last 3 Encounters:  06/28/22 220 lb (99.8 kg)  06/13/22 230 lb (104.3 kg)  03/21/22 225 lb (102.1 kg)    Physical Exam  Constitutional: Patient appears well-developed and well-nourished. Obese *** No distress.  HEENT: head atraumatic, normocephalic, pupils equal and reactive to light, ears ***, neck supple, throat within normal limits Cardiovascular: Normal rate, regular rhythm and normal heart sounds.  No murmur heard. No BLE edema. Pulmonary/Chest: Effort normal and breath sounds normal. No respiratory distress. Abdominal: Soft.  There is no tenderness. Psychiatric: Patient has a normal mood and affect. behavior is normal. Judgment and thought content normal.   Results for orders placed or performed during the hospital encounter of 06/13/22  SARS Coronavirus 2 by RT PCR  (hospital order, performed in [facility] hospital lab) *cepheid single result test* Anterior Nasal Swab   Specimen: Anterior Nasal Swab  Result Value Ref Range   SARS Coronavirus 2 by RT PCR POSITIVE (A) NEGATIVE  Group A Strep by PCR   Specimen: Throat; Sterile Swab  Result Value Ref Range   Group A Strep by PCR NOT DETECTED NOT DETECTED  Basic metabolic panel  Result Value Ref Range   Sodium 134 (L) 135 - 145 mmol/L   Potassium 4.0 3.5 - 5.1 mmol/L   Chloride 103 98 - 111 mmol/L   CO2 23 22 - 32 mmol/L   Glucose, Bld 99 70 - 99 mg/dL   BUN 10 6 - 20 mg/dL   Creatinine, Ser 0.57 0.44 - 1.00 mg/dL   Calcium 8.6 (L) 8.9 - 10.3 mg/dL   GFR, Estimated >60 >60 mL/min   Anion gap 8 5 - 15      Assessment & Plan:   Problem List Items Addressed This Visit   None    Follow up plan: No follow-ups on file.

## 2022-09-12 ENCOUNTER — Other Ambulatory Visit: Payer: Self-pay

## 2022-09-12 ENCOUNTER — Ambulatory Visit (INDEPENDENT_AMBULATORY_CARE_PROVIDER_SITE_OTHER): Payer: Self-pay | Admitting: Nurse Practitioner

## 2022-09-12 ENCOUNTER — Encounter: Payer: Self-pay | Admitting: Nurse Practitioner

## 2022-09-12 VITALS — BP 124/82 | HR 95 | Temp 98.3°F | Resp 16 | Ht 65.0 in | Wt 235.6 lb

## 2022-09-12 DIAGNOSIS — G8929 Other chronic pain: Secondary | ICD-10-CM

## 2022-09-12 DIAGNOSIS — Z131 Encounter for screening for diabetes mellitus: Secondary | ICD-10-CM

## 2022-09-12 DIAGNOSIS — F331 Major depressive disorder, recurrent, moderate: Secondary | ICD-10-CM | POA: Insufficient documentation

## 2022-09-12 DIAGNOSIS — Z1159 Encounter for screening for other viral diseases: Secondary | ICD-10-CM

## 2022-09-12 DIAGNOSIS — Z803 Family history of malignant neoplasm of breast: Secondary | ICD-10-CM

## 2022-09-12 DIAGNOSIS — Z3009 Encounter for other general counseling and advice on contraception: Secondary | ICD-10-CM

## 2022-09-12 DIAGNOSIS — Z114 Encounter for screening for human immunodeficiency virus [HIV]: Secondary | ICD-10-CM

## 2022-09-12 DIAGNOSIS — Z13220 Encounter for screening for lipoid disorders: Secondary | ICD-10-CM

## 2022-09-12 DIAGNOSIS — L732 Hidradenitis suppurativa: Secondary | ICD-10-CM | POA: Insufficient documentation

## 2022-09-12 DIAGNOSIS — F419 Anxiety disorder, unspecified: Secondary | ICD-10-CM | POA: Insufficient documentation

## 2022-09-12 DIAGNOSIS — R109 Unspecified abdominal pain: Secondary | ICD-10-CM

## 2022-09-12 DIAGNOSIS — Z7689 Persons encountering health services in other specified circumstances: Secondary | ICD-10-CM

## 2022-09-12 DIAGNOSIS — Z130 Encounter for screening for diseases of the blood and blood-forming organs and certain disorders involving the immune mechanism: Secondary | ICD-10-CM

## 2022-09-12 DIAGNOSIS — R1011 Right upper quadrant pain: Secondary | ICD-10-CM

## 2022-09-12 MED ORDER — ESCITALOPRAM OXALATE 10 MG PO TABS: 10.0000 mg | ORAL_TABLET | Freq: Every day | ORAL | 0 refills | Status: DC

## 2022-09-12 MED ORDER — METRONIDAZOLE 0.75 % EX CREA: TOPICAL_CREAM | Freq: Two times a day (BID) | CUTANEOUS | 1 refills | Status: DC

## 2022-09-12 NOTE — Assessment & Plan Note (Signed)
Start breast mri screening next year

## 2022-09-12 NOTE — Assessment & Plan Note (Signed)
Start lexapro 10 mg daily, follow up in 4 weeks 

## 2022-09-12 NOTE — Assessment & Plan Note (Signed)
Start metronidazole topical.  Referral placed to dermatology

## 2022-09-13 LAB — LIPID PANEL
Cholesterol: 150 mg/dL (ref <200)
HDL: 51 mg/dL (ref >=50)
LDL Cholesterol (Calc): 81 mg/dL (calc)
Non-HDL Cholesterol (Calc): 99 mg/dL (calc) (ref <130)
Total CHOL/HDL Ratio: 2.9 (calc) (ref <5.0)
Triglycerides: 97 mg/dL (ref <150)

## 2022-09-13 LAB — LIPASE: Lipase: 20 U/L (ref 7–60)

## 2022-09-13 LAB — CBC WITH DIFFERENTIAL/PLATELET
Absolute Monocytes: 341 cells/uL (ref 200–950)
Basophils Absolute: 28 cells/uL (ref 0–200)
Basophils Relative: 0.5 %
Eosinophils Absolute: 61 cells/uL (ref 15–500)
Eosinophils Relative: 1.1 %
HCT: 38.2 % (ref 35.0–45.0)
Hemoglobin: 12.9 g/dL (ref 11.7–15.5)
Lymphs Abs: 1562 cells/uL (ref 850–3900)
MCH: 31.3 pg (ref 27.0–33.0)
MCHC: 33.8 g/dL (ref 32.0–36.0)
MCV: 92.7 fL (ref 80.0–100.0)
MPV: 10.3 fL (ref 7.5–12.5)
Monocytes Relative: 6.2 %
Neutro Abs: 3509 cells/uL (ref 1500–7800)
Neutrophils Relative %: 63.8 %
Platelets: 280 10*3/uL (ref 140–400)
RBC: 4.12 10*6/uL (ref 3.80–5.10)
RDW: 12.4 % (ref 11.0–15.0)
Total Lymphocyte: 28.4 %
WBC: 5.5 10*3/uL (ref 3.8–10.8)

## 2022-09-13 LAB — COMPLETE METABOLIC PANEL WITH GFR
AG Ratio: 1.7 (calc) (ref 1.0–2.5)
ALT: 23 U/L (ref 6–29)
AST: 17 U/L (ref 10–30)
Albumin: 4.4 g/dL (ref 3.6–5.1)
Alkaline phosphatase (APISO): 55 U/L (ref 31–125)
BUN: 11 mg/dL (ref 7–25)
CO2: 25 mmol/L (ref 20–32)
Calcium: 9.2 mg/dL (ref 8.6–10.2)
Chloride: 108 mmol/L (ref 98–110)
Creat: 0.67 mg/dL (ref 0.50–0.97)
Globulin: 2.6 g/dL (calc) (ref 1.9–3.7)
Glucose, Bld: 78 mg/dL (ref 65–99)
Potassium: 4.2 mmol/L (ref 3.5–5.3)
Sodium: 140 mmol/L (ref 135–146)
Total Bilirubin: 0.2 mg/dL (ref 0.2–1.2)
Total Protein: 7 g/dL (ref 6.1–8.1)
eGFR: 119 mL/min/{1.73_m2} (ref >=60)

## 2022-09-13 LAB — HEPATITIS C ANTIBODY: Hepatitis C Ab: NONREACTIVE

## 2022-09-13 LAB — HIV ANTIBODY (ROUTINE TESTING W REFLEX): HIV 1&2 Ab, 4th Generation: NONREACTIVE

## 2022-09-13 LAB — HEMOGLOBIN A1C
Hgb A1c MFr Bld: 4.9 % of total Hgb (ref <5.7)
Mean Plasma Glucose: 94 mg/dL
eAG (mmol/L): 5.2 mmol/L

## 2022-10-09 ENCOUNTER — Ambulatory Visit
Admission: RE | Admit: 2022-10-09 | Discharge: 2022-10-09 | Disposition: A | Discharge status: Home / Self Care | Payer: BC Managed Care – PPO | Source: Ambulatory Visit | Attending: Nurse Practitioner | Admitting: Nurse Practitioner

## 2022-10-09 VITALS — BP 115/81 | HR 74 | Temp 98.9°F | Resp 16 | Ht 63.0 in | Wt 225.0 lb

## 2022-10-09 DIAGNOSIS — J029 Acute pharyngitis, unspecified: Secondary | ICD-10-CM

## 2022-10-09 LAB — GROUP A STREP BY PCR: Group A Strep by PCR: NOT DETECTED

## 2022-10-09 NOTE — ED Provider Notes (Signed)
[facility]    CSN: [id number] Arrival date & time: 10/09/22  1641      History   Chief Complaint Chief Complaint  Patient presents with   Sore Throat    Appt    HPI Diane Myers is a 32 y.o. female  presents for evaluation of URI symptoms for 2 days. Patient reports associated symptoms of sore throat. Denies N/V/D, cough, congestion, fevers, ear pain, body aches, shortness of breath.  Patient reports her son has strep throat.  Pt has taken Tylenol OTC for symptoms. Pt has no other concerns at this time.    Sore Throat    Past Medical History:  Diagnosis Date   Anxiety    Depression    Family history of breast cancer    Family history of breast cancer     Patient Active Problem List   Diagnosis Date Noted   Moderate episode of recurrent major depressive disorder (HCC) 09/12/2022   Anxiety 09/12/2022   Hidradenitis suppurativa 09/12/2022   Genetic testing 08/19/2018   Family history of breast cancer    At high risk for breast cancer 08/03/2018    Past Surgical History:  Procedure Laterality Date   CHOLECYSTECTOMY     GALLBLADDER SURGERY      OB History   No obstetric history on file.      Home Medications    Prior to Admission medications   Medication Sig Start Date End Date Taking? Authorizing Provider  escitalopram (LEXAPRO) 10 MG tablet Take 1 tablet (10 mg total) by mouth daily. 09/12/22  Yes Berniece Salines, FNP  metroNIDAZOLE (METROCREAM) 0.75 % cream Apply topically 2 (two) times daily. 09/12/22  Yes Berniece Salines, FNP  Etonogestrel [facility]) Inject into the skin.    [provider]    Family History Family History  Problem Relation Age of Onset   Cancer Mother    Breast cancer Mother 39   Alcohol abuse Father    Hypertension Father    Cancer Maternal Aunt    Breast cancer Maternal Aunt        2 maternal great aunts   Breast cancer Cousin    Breast cancer Other     Social History Social History    Tobacco Use   Smoking status: Former    Packs/day: .5    Types: Cigarettes    Quit date: 06/14/2019    Years since quitting: 3.3   Smokeless tobacco: Never  Vaping Use   Vaping Use: Former  Substance Use Topics   Alcohol use: Yes    Comment: social   Drug use: No     Allergies   Cephalosporins, Cetirizine, Doxycycline, Erythromycin, Sulfa antibiotics, Amoxicillin, and Clindamycin   Review of Systems Review of Systems  HENT:  Positive for sore throat.      Physical Exam Triage Vital Signs ED Triage Vitals  Enc Vitals Group     BP 10/09/22 1654 115/81     Pulse Rate 10/09/22 1654 74     Resp 10/09/22 1654 16     Temp 10/09/22 1654 98.9 F (37.2 C)     Temp Source 10/09/22 1654 Oral     SpO2 10/09/22 1654 98 %     Weight 10/09/22 1654 225 lb (102.1 kg)     Height 10/09/22 1654 5\' 3"  (1.6 m)     Head Circumference --      Peak Flow --      Pain Score 10/09/22 1657 4  Pain Loc --      Pain Edu? --      Excl. in [place]? --    No data found.  Updated Vital Signs BP 115/81 (BP Location: Right Arm)   Pulse 74   Temp 98.9 F (37.2 C) (Oral)   Resp 16   Ht 5\' 3"  (1.6 m)   Wt 225 lb (102.1 kg)   LMP 09/12/2022   SpO2 98%   BMI 39.86 kg/m   Visual Acuity Right Eye Distance:   Left Eye Distance:   Bilateral Distance:    Right Eye Near:   Left Eye Near:    Bilateral Near:     Physical Exam Vitals and nursing note reviewed.  Constitutional:      General: She is not in acute distress.    Appearance: She is well-developed. She is not ill-appearing.  HENT:     Head: Normocephalic and atraumatic.     Right Ear: Tympanic membrane and ear canal normal.     Left Ear: Tympanic membrane and ear canal normal.     Nose: No congestion or rhinorrhea.     Mouth/Throat:     Mouth: Mucous membranes are moist.     Pharynx: Oropharynx is clear. Uvula midline. Posterior oropharyngeal erythema present.     Tonsils: No tonsillar exudate or tonsillar abscesses.  Eyes:      Conjunctiva/sclera: Conjunctivae normal.     Pupils: Pupils are equal, round, and reactive to light.  Cardiovascular:     Rate and Rhythm: Normal rate and regular rhythm.     Heart sounds: Normal heart sounds.  Pulmonary:     Effort: Pulmonary effort is normal.     Breath sounds: Normal breath sounds.  Musculoskeletal:     Cervical back: Normal range of motion and neck supple.  Lymphadenopathy:     Cervical: No cervical adenopathy.  Skin:    General: Skin is warm and dry.  Neurological:     General: No focal deficit present.     Mental Status: She is alert and oriented to person, place, and time.  Psychiatric:        Mood and Affect: Mood normal.        Behavior: Behavior normal.      UC Treatments / Results  Labs (all labs ordered are listed, but only abnormal results are displayed) Labs Reviewed  GROUP A STREP BY PCR    EKG   Radiology No results found.  Procedures Procedures (including critical care time)  Medications Ordered in UC Medications - No data to display  Initial Impression / Assessment and Plan / UC Course  I have reviewed the triage vital signs and the nursing notes.  Pertinent labs & imaging results that were available during my care of the patient were reviewed by me and considered in my medical decision making (see chart for details).     Negative strep PCR.  Reviewed with patient.  Discussed viral pharyngitis and symptomatic treatment PCP follow up if symptoms do not improve  ER precautions reviewed  Final Clinical Impressions(s) / UC Diagnoses   Final diagnoses:  Viral pharyngitis     Discharge Instructions      Your strep test was negative.  Please treat your symptoms with over-the-counter Tylenol or ibuprofen, salt water gargles and warm liquids. Please follow-up with your PCP if your symptoms do not improve Please go to the ER for any worsening symptoms     ED Prescriptions   None  PDMP not reviewed this  encounter.   Radford Pax, NP 10/09/22 [phone]

## 2022-10-09 NOTE — ED Triage Notes (Signed)
Pt c/o sore throat x2 days. Denies any fevers. States son tested + for strep on Friday.

## 2022-10-09 NOTE — Discharge Instructions (Signed)
Your strep test was negative.  Please treat your symptoms with over-the-counter Tylenol or ibuprofen, salt water gargles and warm liquids. Please follow-up with your PCP if your symptoms do not improve Please go to the ER for any worsening symptoms

## 2022-10-17 ENCOUNTER — Encounter: Payer: Self-pay | Admitting: Nurse Practitioner

## 2022-10-17 ENCOUNTER — Ambulatory Visit: Payer: BC Managed Care – PPO | Admitting: Nurse Practitioner

## 2022-10-17 ENCOUNTER — Other Ambulatory Visit: Payer: Self-pay

## 2022-10-17 VITALS — BP 130/72 | HR 78 | Temp 98.0°F | Resp 16 | Ht 63.0 in | Wt 231.8 lb

## 2022-10-17 DIAGNOSIS — F331 Major depressive disorder, recurrent, moderate: Secondary | ICD-10-CM | POA: Diagnosis not present

## 2022-10-17 DIAGNOSIS — F419 Anxiety disorder, unspecified: Secondary | ICD-10-CM | POA: Diagnosis not present

## 2022-10-17 MED ORDER — DULOXETINE HCL 30 MG PO CPEP
30.0000 mg | ORAL_CAPSULE | Freq: Two times a day (BID) | ORAL | 0 refills | Status: DC
Start: 2022-10-17 — End: 2022-11-01

## 2022-10-17 NOTE — Assessment & Plan Note (Signed)
Stop lexapro and start cymbalta 30 mg BID

## 2022-10-17 NOTE — Assessment & Plan Note (Signed)
Stop lexapro and start cymbalta 30 mg BID 

## 2022-10-17 NOTE — Progress Notes (Signed)
BP 130/72   Pulse 78   Temp 98 F (36.7 C) (Oral)   Resp 16   Ht 5\' 3"  (1.6 m)   Wt 231 lb 12.8 oz (105.1 kg)   LMP 10/17/2022   SpO2 98%   BMI 41.06 kg/m    Subjective:    Patient ID: Diane Myers, female    DOB: Oct 20, 1990, 32 y.o.   MRN: [id number]  HPI: Diane Myers is a 32 y.o. female  Chief Complaint  Patient presents with   Depression   Anxiety    4 week recheck   Anxiety/depression: last appointment her PHQ9 and GAD scores were positive.  She reported she had previously been on prozac, zoloft.  She reported that the prozac helped but messed with her sexual activity. She switched to zoloft which helped a little then she stopped taking it.  Started her on lexapro. Patient here for four week follow up. Patient PHQ9 and GAD scores have worsened. Patient reports that she feels like her anxiety has worsened. She says she feels like the depression has gotten a little better. Discussed that the lexapro does not seem to have helped her at all. Will trial cymbalta.       10/17/2022    1:43 PM 09/12/2022    8:04 AM  Depression screen PHQ 2/9  Decreased Interest 2 1  Down, Depressed, Hopeless 1 1  PHQ - 2 Score 3 2  Altered sleeping 2 2  Tired, decreased energy 2 1  Change in appetite 0 0  Feeling bad or failure about yourself  2 1  Trouble concentrating 2 2  Moving slowly or fidgety/restless 0 0  Suicidal thoughts 0 0  PHQ-9 Score 11 8  Difficult doing work/chores Somewhat difficult Somewhat difficult       10/17/2022    1:43 PM 09/12/2022    8:05 AM  GAD 7 : Generalized Anxiety Score  Nervous, Anxious, on Edge 3 2  Control/stop worrying 2 2  Worry too much - different things 2 2  Trouble relaxing 2 2  Restless 2 1  Easily annoyed or irritable 2 2  Afraid - awful might happen 2 1  Total GAD 7 Score 15 12  Anxiety Difficulty Somewhat difficult Somewhat difficult   Relevant past medical, surgical, family and social history reviewed and updated as indicated.  Interim medical history since our last visit reviewed. Allergies and medications reviewed and updated.  Review of Systems  Constitutional: Negative for fever or weight change.  Respiratory: Negative for cough and shortness of breath.   Cardiovascular: Negative for chest pain or palpitations.  Gastrointestinal: Negative for abdominal pain, no bowel changes.  Musculoskeletal: Negative for gait problem or joint swelling.  Skin: Negative for rash.  Neurological: Negative for dizziness or headache.  No other specific complaints in a complete review of systems (except as listed in HPI above).      Objective:    BP 130/72   Pulse 78   Temp 98 F (36.7 C) (Oral)   Resp 16   Ht 5\' 3"  (1.6 m)   Wt 231 lb 12.8 oz (105.1 kg)   LMP 10/17/2022   SpO2 98%   BMI 41.06 kg/m   Wt Readings from Last 3 Encounters:  10/17/22 231 lb 12.8 oz (105.1 kg)  10/09/22 225 lb (102.1 kg)  09/12/22 235 lb 9.6 oz (106.9 kg)    Physical Exam  Constitutional: Patient appears well-developed and well-nourished. Obese  No distress.  HEENT: head  atraumatic, normocephalic, pupils equal and reactive to light, neck supple Cardiovascular: Normal rate, regular rhythm and normal heart sounds.  No murmur heard. No BLE edema. Pulmonary/Chest: Effort normal and breath sounds normal. No respiratory distress. Abdominal: Soft.  RUQ abdominal tenderness Psychiatric: Patient has a normal mood and affect. behavior is normal. Judgment and thought content normal.   Results for orders placed or performed during the hospital encounter of 10/09/22  Group A Strep by PCR   Specimen: Throat; Sterile Swab  Result Value Ref Range   Group A Strep by PCR NOT DETECTED NOT DETECTED      Assessment & Plan:   Problem List Items Addressed This Visit       Other   Moderate episode of recurrent major depressive disorder (HCC) - Primary    Stop lexapro and start cymbalta 30 mg BID      Relevant Medications   DULoxetine (CYMBALTA)  30 MG capsule   Anxiety    Stop lexapro and start cymbalta 30 mg BID      Relevant Medications   DULoxetine (CYMBALTA) 30 MG capsule     Follow up plan: Return in about 4 weeks (around 11/14/2022) for follow up.

## 2022-10-18 ENCOUNTER — Encounter: Payer: Self-pay | Admitting: Licensed Clinical Social Worker

## 2022-10-18 NOTE — Progress Notes (Signed)
  UPDATE: VUS in NF1 called [id number]  has been reclassified to "Likely Benign." The amended report date is 10/01/2022.

## 2022-10-31 ENCOUNTER — Encounter: Payer: Self-pay | Admitting: Nurse Practitioner

## 2022-10-31 NOTE — Progress Notes (Signed)
BP 120/70   Pulse 97   Temp 97.9 F (36.6 C) (Oral)   Resp 16   Ht 5\' 5"  (1.651 m)   Wt 230 lb 8 oz (104.6 kg)   LMP 10/17/2022   SpO2 98%   BMI 38.36 kg/m    Subjective:    Patient ID: Diane Myers, female    DOB: 04-28-1991, 32 y.o.   MRN: [id number]  HPI: Diane Myers is a 32 y.o. female  Chief Complaint  Patient presents with   Medication Problem    Do not like cymbalta   Anxiety/depression: last appointment her PHQ9 and GAD scores were positive.  She reported she had previously been on prozac, zoloft.  She reported that the prozac helped but messed with her sexual activity. She switched to zoloft which helped a little then she stopped taking it.  Started her on lexapro. She came back for four week follow up and her PHQ9 and GAD scores had worsened.  She had reported her anxiety was worse.  We then tried cymbalta but patient is reporting that  she is even more depressed and anxious.  Will trial zoloft 25 mg daily and wellbutrin 150 mg daily.  Patient to follow up in 4 weeks.  Discussed that we may be at the point where she needs to be assessed by psychiatry.        11/01/2022   11:37 AM 10/17/2022    1:43 PM 09/12/2022    8:04 AM  Depression screen PHQ 2/9  Decreased Interest 3 2 1   Down, Depressed, Hopeless 2 1 1   PHQ - 2 Score 5 3 2   Altered sleeping 2 2 2   Tired, decreased energy 3 2 1   Change in appetite 2 0 0  Feeling bad or failure about yourself  2 2 1   Trouble concentrating 1 2 2   Moving slowly or fidgety/restless 0 0 0  Suicidal thoughts 0 0 0  PHQ-9 Score 15 11 8   Difficult doing work/chores Somewhat difficult Somewhat difficult Somewhat difficult       11/01/2022   11:37 AM 10/17/2022    1:43 PM 09/12/2022    8:05 AM  GAD 7 : Generalized Anxiety Score  Nervous, Anxious, on Edge 2 3 2   Control/stop worrying 2 2 2   Worry too much - different things 2 2 2   Trouble relaxing 2 2 2   Restless 0 2 1  Easily annoyed or irritable 0 2 2  Afraid - awful  might happen 0 2 1  Total GAD 7 Score 8 15 12   Anxiety Difficulty Somewhat difficult Somewhat difficult Somewhat difficult   Relevant past medical, surgical, family and social history reviewed and updated as indicated. Interim medical history since our last visit reviewed. Allergies and medications reviewed and updated.  Review of Systems  Constitutional: Negative for fever or weight change.  Respiratory: Negative for cough and shortness of breath.   Cardiovascular: Negative for chest pain or palpitations.  Gastrointestinal: Negative for abdominal pain, no bowel changes.  Musculoskeletal: Negative for gait problem or joint swelling.  Skin: Negative for rash.  Neurological: Negative for dizziness or headache.  No other specific complaints in a complete review of systems (except as listed in HPI above).      Objective:    BP 120/70   Pulse 97   Temp 97.9 F (36.6 C) (Oral)   Resp 16   Ht 5\' 5"  (1.651 m)   Wt 230 lb 8 oz (104.6 kg)  LMP 10/17/2022   SpO2 98%   BMI 38.36 kg/m   Wt Readings from Last 3 Encounters:  11/01/22 230 lb 8 oz (104.6 kg)  10/17/22 231 lb 12.8 oz (105.1 kg)  10/09/22 225 lb (102.1 kg)    Physical Exam  Constitutional: Patient appears well-developed and well-nourished. Obese  No distress.  HEENT: head atraumatic, normocephalic, pupils equal and reactive to light, neck supple Cardiovascular: Normal rate, regular rhythm and normal heart sounds.  No murmur heard. No BLE edema. Pulmonary/Chest: Effort normal and breath sounds normal. No respiratory distress. Abdominal: Soft.  RUQ abdominal tenderness Psychiatric: Patient has a normal mood and affect. behavior is normal. Judgment and thought content normal.   Results for orders placed or performed during the hospital encounter of 10/09/22  Group A Strep by PCR   Specimen: Throat; Sterile Swab  Result Value Ref Range   Group A Strep by PCR NOT DETECTED NOT DETECTED      Assessment & Plan:   Problem  List Items Addressed This Visit       Other   Moderate episode of recurrent major depressive disorder (HCC) - Primary    Start wellbutrin 150 mg daily and zoloft 25 mg daily. Stop cymbalta.       Relevant Medications   sertraline (ZOLOFT) 25 MG tablet   buPROPion (WELLBUTRIN XL) 150 MG 24 hr tablet   Anxiety    Start wellbutrin 150 mg daily and zoloft 25 mg daily. Stop cymbalta.       Relevant Medications   sertraline (ZOLOFT) 25 MG tablet   buPROPion (WELLBUTRIN XL) 150 MG 24 hr tablet     Follow up plan: Return in about 4 weeks (around 11/29/2022) for follow up.

## 2022-11-01 ENCOUNTER — Other Ambulatory Visit: Payer: Self-pay

## 2022-11-01 ENCOUNTER — Ambulatory Visit: Payer: BC Managed Care – PPO | Admitting: Nurse Practitioner

## 2022-11-01 ENCOUNTER — Encounter: Payer: Self-pay | Admitting: Nurse Practitioner

## 2022-11-01 VITALS — BP 120/70 | HR 97 | Temp 97.9°F | Resp 16 | Ht 65.0 in | Wt 230.5 lb

## 2022-11-01 DIAGNOSIS — F331 Major depressive disorder, recurrent, moderate: Secondary | ICD-10-CM

## 2022-11-01 DIAGNOSIS — F419 Anxiety disorder, unspecified: Secondary | ICD-10-CM | POA: Diagnosis not present

## 2022-11-01 MED ORDER — SERTRALINE HCL 25 MG PO TABS
25.0000 mg | ORAL_TABLET | Freq: Every day | ORAL | 0 refills | Status: DC
Start: 2022-11-01 — End: 2022-11-23

## 2022-11-01 MED ORDER — BUPROPION HCL ER (XL) 150 MG PO TB24
150.0000 mg | ORAL_TABLET | Freq: Every day | ORAL | 0 refills | Status: DC
Start: 2022-11-01 — End: 2022-11-23

## 2022-11-01 NOTE — Assessment & Plan Note (Signed)
Start wellbutrin 150 mg daily and zoloft 25 mg daily. Stop cymbalta.  

## 2022-11-01 NOTE — Assessment & Plan Note (Signed)
Start wellbutrin 150 mg daily and zoloft 25 mg daily. Stop cymbalta.

## 2022-11-07 ENCOUNTER — Encounter: Payer: BC Managed Care – PPO | Admitting: Obstetrics and Gynecology

## 2022-11-14 ENCOUNTER — Ambulatory Visit: Payer: BC Managed Care – PPO | Admitting: Nurse Practitioner

## 2022-11-21 ENCOUNTER — Ambulatory Visit: Payer: BC Managed Care – PPO | Admitting: Nurse Practitioner

## 2022-11-23 ENCOUNTER — Other Ambulatory Visit: Payer: Self-pay

## 2022-11-23 ENCOUNTER — Encounter: Payer: Self-pay | Admitting: Nurse Practitioner

## 2022-11-23 ENCOUNTER — Ambulatory Visit: Payer: BC Managed Care – PPO | Admitting: Nurse Practitioner

## 2022-11-23 VITALS — BP 120/72 | HR 96 | Temp 97.9°F | Resp 18 | Ht 65.0 in | Wt 230.6 lb

## 2022-11-23 DIAGNOSIS — G4709 Other insomnia: Secondary | ICD-10-CM

## 2022-11-23 DIAGNOSIS — F419 Anxiety disorder, unspecified: Secondary | ICD-10-CM | POA: Diagnosis not present

## 2022-11-23 DIAGNOSIS — F331 Major depressive disorder, recurrent, moderate: Secondary | ICD-10-CM

## 2022-11-23 MED ORDER — SERTRALINE HCL 25 MG PO TABS: 25.0000 mg | ORAL_TABLET | Freq: Every day | ORAL | 0 refills | Status: DC

## 2022-11-23 MED ORDER — ZOLPIDEM TARTRATE 5 MG PO TABS
5.0000 mg | ORAL_TABLET | Freq: Every evening | ORAL | 1 refills | Status: DC | PRN
Start: 2022-11-23 — End: 2023-01-25

## 2022-11-23 MED ORDER — BUPROPION HCL ER (XL) 150 MG PO TB24
150.0000 mg | ORAL_TABLET | Freq: Every day | ORAL | 0 refills | Status: DC
Start: 2022-11-23 — End: 2023-02-18

## 2022-11-23 NOTE — Assessment & Plan Note (Signed)
Doing well. Continue wellbutrin 150 mg daily and zoloft 25 mg daily, follow up in three months

## 2022-11-23 NOTE — Assessment & Plan Note (Addendum)
Doing well. Continue wellbutrin 150 mg daily and zoloft 25 mg daily, follow up in three months 

## 2022-11-23 NOTE — Progress Notes (Signed)
BP 120/72   Pulse 96   Temp 97.9 F (36.6 C) (Oral)   Resp 18   Ht 5\' 5"  (1.651 m)   Wt 230 lb 9.6 oz (104.6 kg)   SpO2 99%   BMI 38.37 kg/m    Subjective:    Patient ID: Diane Myers, female    DOB: Jul 20, 1990, 32 y.o.   MRN: [id number]  HPI: Diane Myers is a 32 y.o. female  Chief Complaint  Patient presents with   Depression   Anxiety    4 week recheck   Depression/anxiety/insomnia:  Medication zoloft 25 mg daily and wellbutrin 150 mg daily.   Previous medication: prozac, zoloft, cymbalta, lexapro  Compliant : yes Side effects: no PHQ9: improved GAD: improved Therapy: no  Notes from  last appointment her PHQ9 and GAD scores were positive.  She reported she had previously been on prozac, zoloft.  She reported that the prozac helped but messed with her sexual activity. She switched to zoloft which helped a little then she stopped taking it.  Started her on lexapro. She came back for four week follow up and her PHQ9 and GAD scores had worsened.  She had reported her anxiety was worse.  We then tried cymbalta but patient is reporting that  she is even more depressed and anxious.  Last visit started her on zoloft 25 mg daily and wellbutrin 150 mg daily.  Patient here for four week follow up.  Discussed at last visit that we may be at the point where she needs to be assessed by psychiatry.    She reports she is still struggling with insomnia. She says she just lays there and can't fall asleep.  She has tried melatonin.  But it only worked for a little bit.  She tried trazodone which did not help.  She has tried hydroxyzine but does not recall if it did anything.      11/23/2022    2:20 PM 11/01/2022   11:37 AM 10/17/2022    1:43 PM 09/12/2022    8:04 AM  Depression screen PHQ 2/9  Decreased Interest 0 3 2 1   Down, Depressed, Hopeless 0 2 1 1   PHQ - 2 Score 0 5 3 2   Altered sleeping 3 2 2 2   Tired, decreased energy 0 3 2 1   Change in appetite 0 2 0 0  Feeling bad or  failure about yourself  1 2 2 1   Trouble concentrating 0 1 2 2   Moving slowly or fidgety/restless 1 0 0 0  Suicidal thoughts 0 0 0 0  PHQ-9 Score 5 15 11 8   Difficult doing work/chores Not difficult at all Somewhat difficult Somewhat difficult Somewhat difficult       11/23/2022    2:20 PM 11/01/2022   11:37 AM 10/17/2022    1:43 PM 09/12/2022    8:05 AM  GAD 7 : Generalized Anxiety Score  Nervous, Anxious, on Edge 0 2 3 2   Control/stop worrying 0 2 2 2   Worry too much - different things 1 2 2 2   Trouble relaxing 0 2 2 2   Restless 1 0 2 1  Easily annoyed or irritable 0 0 2 2  Afraid - awful might happen 0 0 2 1  Total GAD 7 Score 2 8 15 12   Anxiety Difficulty Not difficult at all Somewhat difficult Somewhat difficult Somewhat difficult   Relevant past medical, surgical, family and social history reviewed and updated as indicated. Interim medical history since  our last visit reviewed. Allergies and medications reviewed and updated.  Review of Systems  Constitutional: Negative for fever or weight change.  Respiratory: Negative for cough and shortness of breath.   Cardiovascular: Negative for chest pain or palpitations.  Gastrointestinal: Negative for abdominal pain, no bowel changes.  Musculoskeletal: Negative for gait problem or joint swelling.  Skin: Negative for rash.  Neurological: Negative for dizziness or headache.  No other specific complaints in a complete review of systems (except as listed in HPI above).      Objective:    BP 120/72   Pulse 96   Temp 97.9 F (36.6 C) (Oral)   Resp 18   Ht 5\' 5"  (1.651 m)   Wt 230 lb 9.6 oz (104.6 kg)   SpO2 99%   BMI 38.37 kg/m   Wt Readings from Last 3 Encounters:  11/23/22 230 lb 9.6 oz (104.6 kg)  11/01/22 230 lb 8 oz (104.6 kg)  10/17/22 231 lb 12.8 oz (105.1 kg)    Physical Exam  Constitutional: Patient appears well-developed and well-nourished. Obese  No distress.  HEENT: head atraumatic, normocephalic, pupils equal  and reactive to light, neck supple Cardiovascular: Normal rate, regular rhythm and normal heart sounds.  No murmur heard. No BLE edema. Pulmonary/Chest: Effort normal and breath sounds normal. No respiratory distress. Abdominal: Soft.  RUQ abdominal tenderness Psychiatric: Patient has a normal mood and affect. behavior is normal. Judgment and thought content normal.       Assessment & Plan:   Problem List Items Addressed This Visit       Other   Moderate episode of recurrent major depressive disorder (HCC) - Primary    Doing well. Continue wellbutrin 150 mg daily and zoloft 25 mg daily, follow up in three months      Relevant Medications   sertraline (ZOLOFT) 25 MG tablet   buPROPion (WELLBUTRIN XL) 150 MG 24 hr tablet   Anxiety    Doing well. Continue wellbutrin 150 mg daily and zoloft 25 mg daily, follow up in three months      Relevant Medications   sertraline (ZOLOFT) 25 MG tablet   buPROPion (WELLBUTRIN XL) 150 MG 24 hr tablet   Other Visit Diagnoses     Other insomnia       start ambien 5 mg at bedtime   Relevant Medications   zolpidem (AMBIEN) 5 MG tablet        Follow up plan: Return in about 3 months (around 02/23/2023) for follow up.

## 2022-11-28 ENCOUNTER — Encounter: Payer: BC Managed Care – PPO | Admitting: Licensed Practical Nurse

## 2022-12-17 ENCOUNTER — Ambulatory Visit: Payer: BC Managed Care – PPO | Admitting: Gastroenterology

## 2022-12-17 ENCOUNTER — Encounter: Payer: Self-pay | Admitting: Gastroenterology

## 2022-12-17 VITALS — BP 106/69 | HR 96 | Temp 98.2°F | Ht 63.0 in | Wt 232.0 lb

## 2022-12-17 DIAGNOSIS — R197 Diarrhea, unspecified: Secondary | ICD-10-CM | POA: Diagnosis not present

## 2022-12-17 MED ORDER — CHOLESTYRAMINE 4 G PO PACK: 4.0000 g | PACK | Freq: Four times a day (QID) | ORAL | 5 refills | Status: DC

## 2022-12-17 NOTE — Progress Notes (Signed)
Gastroenterology Consultation  Referring Provider:     Berniece Salines, FNP Primary Care Physician:  Berniece Salines, FNP Primary Gastroenterologist:  Dr. Servando Snare     Reason for Consultation:     Chronic abdominal pain        HPI:   Diane Myers is a 32 y.o. y/o female referred for consultation & management of chronic abdominal pain by Dr. Zane Herald, Rudolpho Sevin, FNP.  This patient comes in for a history of chronic abdominal pain.  The patient had her gallbladder removed in 2010 and states that she has had abdominal pain since then.  The pain is a crampy pain that she reports to be in the right upper quadrant.  She also reports that the pain can sometimes be generalized abdominal pain and can be severe.  The patient also reports that she can have diarrhea that will come up on her quickly even before she finishes a meal.  The diarrhea seems to be a very frequent problem.  The patient was set up for an ultrasound in April but it does not appear that she had that done and had blood work sent off that showed no abnormalities.  The patient does take Imodium for her diarrhea. The patient thinks that all of her symptoms started around the time she had her gallbladder out.  She denies any unexplained weight loss fevers chills nausea or vomiting.  She does take 3 Imodium tablets on a daily basis and that decreases her diarrhea to usually 1-3 times a day.   Past Medical History:  Diagnosis Date   Anxiety    Depression    Family history of breast cancer    Family history of breast cancer     Past Surgical History:  Procedure Laterality Date   CHOLECYSTECTOMY     GALLBLADDER SURGERY      Prior to Admission medications   Medication Sig Start Date End Date Taking? Authorizing Provider  buPROPion (WELLBUTRIN XL) 150 MG 24 hr tablet Take 1 tablet (150 mg total) by mouth daily. 11/23/22   Berniece Salines, FNP  sertraline (ZOLOFT) 25 MG tablet Take 1 tablet (25 mg total) by mouth daily. 11/23/22   Berniece Salines, FNP  zolpidem (AMBIEN) 5 MG tablet Take 1 tablet (5 mg total) by mouth at bedtime as needed for sleep. 11/23/22   Berniece Salines, FNP    Family History  Problem Relation Age of Onset   Cancer Mother    Breast cancer Mother 61   Alcohol abuse Father    Hypertension Father    Cancer Maternal Aunt    Breast cancer Maternal Aunt        2 maternal great aunts   Breast cancer Cousin    Breast cancer Other      Social History   Tobacco Use   Smoking status: Former    Packs/day: .5    Types: Cigarettes    Quit date: 06/14/2019    Years since quitting: 3.5   Smokeless tobacco: Never  Vaping Use   Vaping Use: Former  Substance Use Topics   Alcohol use: Yes    Comment: social   Drug use: No    Allergies as of 12/17/2022 - Review Complete 12/17/2022  Allergen Reaction Noted   Cephalosporins  08/10/2020   Cetirizine  08/10/2020   Doxycycline Other (See Comments) 06/13/2022   Erythromycin  08/10/2020   Sulfa antibiotics  08/10/2020   Amoxicillin Rash 12/03/2014   Clindamycin  Rash 12/22/2016    Review of Systems:    All systems reviewed and negative except where noted in HPI.   Physical Exam:  BP 106/69 (BP Location: Left Arm, Patient Position: Sitting, Cuff Size: Large)   Pulse 96   Temp 98.2 F (36.8 C) (Oral)   Ht 5\' 3"  (1.6 m)   Wt 232 lb (105.2 kg)   BMI 41.10 kg/m  No LMP recorded. General:   Alert,  Well-developed, well-nourished, pleasant and cooperative in NAD Head:  Normocephalic and atraumatic. Eyes:  Sclera clear, no icterus.   Conjunctiva pink. Ears:  Normal auditory acuity. Neck:  Supple; no masses or thyromegaly. Lungs:  Respirations even and unlabored.  Clear throughout to auscultation.   No wheezes, crackles, or rhonchi. No acute distress. Heart:  Regular rate and rhythm; no murmurs, clicks, rubs, or gallops. Abdomen:  Normal bowel sounds.  No bruits.  Soft, non-tender and non-distended without masses, hepatosplenomegaly or hernias noted.  No  guarding or rebound tenderness.  Negative Carnett sign.   Rectal:  Deferred.  Pulses:  Normal pulses noted. Extremities:  No clubbing or edema.  No cyanosis. Neurologic:  Alert and oriented x3;  grossly normal neurologically. Skin:  Intact without significant lesions or rashes.  No jaundice. Lymph Nodes:  No significant cervical adenopathy. Psych:  Alert and cooperative. Normal mood and affect.  Imaging Studies: No results found.  Assessment and Plan:   Diane Myers is a 32 y.o. y/o female who comes in today with what sounds like bilious diarrhea.  The patient may also have some underlying irritable bowel syndrome.  The patient will have her stool sent off for fecal calprotectin and will have her blood sent off for celiac sprue.  The patient will also be started on Questran 4 g 4 times a day and she will be titrated to effect.  If her symptoms do not improve she has been told to contact me for upon further investigation or treatment may be initiated.  The patient has been explained the plan and agrees with it.    Midge Minium, MD. Clementeen Graham    Note: This dictation was prepared with Dragon dictation along with smaller phrase technology. Any transcriptional errors that result from this process are unintentional.

## 2022-12-18 LAB — CELIAC DISEASE PANEL: IgA/Immunoglobulin A, Serum: 308 mg/dL (ref 87–352)

## 2022-12-19 LAB — CELIAC DISEASE PANEL
Endomysial IgA: NEGATIVE
Transglutaminase IgA: <2 U/mL (ref 0–3)

## 2022-12-24 ENCOUNTER — Ambulatory Visit: Payer: BC Managed Care – PPO | Admitting: Licensed Practical Nurse

## 2022-12-24 VITALS — BP 125/70 | HR 88 | Ht 63.0 in | Wt 233.1 lb

## 2022-12-24 DIAGNOSIS — Z3202 Encounter for pregnancy test, result negative: Secondary | ICD-10-CM

## 2022-12-24 DIAGNOSIS — Z3046 Encounter for surveillance of implantable subdermal contraceptive: Secondary | ICD-10-CM | POA: Diagnosis not present

## 2022-12-24 DIAGNOSIS — Z30017 Encounter for initial prescription of implantable subdermal contraceptive: Secondary | ICD-10-CM

## 2022-12-24 LAB — POCT URINE PREGNANCY: Preg Test, Ur: NEGATIVE

## 2022-12-24 MED ORDER — ETONOGESTREL 68 MG ~~LOC~~ IMPL
68.0000 mg | DRUG_IMPLANT | Freq: Once | SUBCUTANEOUS | Status: AC
Start: 2022-12-24 — End: 2022-12-24
  Administered 2022-12-24: 68 mg via SUBCUTANEOUS

## 2022-12-24 NOTE — Progress Notes (Signed)
Pt here for Nexplanon removal and insertion. Her Nexplanon was inserted at the [facility] about 4 years ago, she lost her health insurance so has not been able to have it removed until now. She desires another Neplanon for cycle control. She has been getting somewhat heavy cycles (regular and  lasting 7-8 days) for the last year and a half. Her husband had a vasectomy.   BP 125/70   Pulse 88   Ht 5\' 3"  (1.6 m)   Wt 233 lb 1.6 oz (105.7 kg)   LMP 12/15/2022 (Approximate)   BMI 41.29 kg/m    GYNECOLOGY PROCEDURE NOTE  Implanon removal discussed in detail.  Risks of infection, bleeding, nerve injury all reviewed.  Patient understands risks and desires to proceed.  Verbal consent obtained.  Patient is certain she wants the implanon removed.  All questions answered.  Procedure: Patient placed in dorsal supine with left arm above head, elbow flexed at 90 degrees, arm resting on examination table.  Implanon identified without problems.  Betadine scrub x3.  1 ml of 1% lidocaine injected under implanon device without problems.  Sterile gloves applied.  Small 0.5cm incision made at distal tip of implanon device with 11 blade scalpel.  Implanon brought to incision and grasped with a small Traeh clamp.  Implanon removed intact without problems.  Pressure applied to incision.  Hemostasis obtained.   Patient tolerated procedure well.  No complications.   Assessment: 32 y.o. year old female now s/p uncomplicated implanon removal.  Plan: 1.  Patient given post procedure precautions and asked to call for fever, chills, redness or drainage from her incision, bleeding from incision.  She understands she will likely have a small bruise near site of removal and can remove bandage tomorrow and steri-strips in approximately 1 week.   GYNECOLOGY PROCEDURE NOTE  Patient is a 32 y.o. No obstetric history on file. presenting for Nexplanon insertion as her desires means of contraception.  She provided informed consent,  signed copy in the chart, time out was performed. Pregnancy test was neg, with self reported LMP of Patient's last menstrual period was 12/15/2022 (approximate).  She understands that Nexplanon is a progesterone only therapy, and that patients often patients have irregular and unpredictable vaginal bleeding or amenorrhea. She understands that other side effects are possible related to systemic progesterone, including but not limited to, headaches, breast tenderness, nausea, and irritability. While effective at preventing pregnancy long acting reversible contraceptives do not prevent transmission of sexually transmitted diseases and use of barrier methods for this purpose was discussed. The placement procedure for Nexplanon was reviewed with the patient in detail including risks of nerve injury, infection, bleeding and injury to other muscles or tendons. She understands that the Nexplanon implant is good for 3 years and needs to be removed at the end of that time.  She understands that Nexplanon is an extremely effective option for contraception, with failure rate of <1%. This information is reviewed today and all questions were answered. Informed consent was obtained, both verbally and written.   The patient is healthy and has no contraindications to Implanon use. Urine pregnancy test was performed today and was negative.  Procedure Appropriate time out taken.  Patient placed in dorsal supine with left arm above head, elbow flexed at 90 degrees, arm resting on examination table.  The bicipital grove was palpated and site 8-10cm proximal to the medial epicondyle was indentified . The insertion site was prepped with a two betadine swabs and then injected with 1  cc of 1% lidocaine without epinephrine.  Nexplanon removed form sterile blister packaging,  Device confirmed in needle, before inserting full length of needle, tenting up the skin as the needle was advance.  The drug eluting rod was then deployed by  pulling back the slider per the manufactures recommendation.  The implant was palpable by the clinician as well as the patient.  The insertion site covered dressed with a band aid before applying  a kerlex bandage pressure dressing..Minimal blood loss was noted during the procedure.  The patientt tolerated the procedure well.   She was instructed to wear the bandage for 24 hours, call with any signs of infection.  She was given the Implanon card and instructed to have the rod removed in 3 years.  Reminded to schedule annual exam    Jannifer Hick   [facility]  12/24/22  2:36 PM

## 2023-01-07 ENCOUNTER — Other Ambulatory Visit: Payer: Self-pay | Admitting: Nurse Practitioner

## 2023-01-07 DIAGNOSIS — F331 Major depressive disorder, recurrent, moderate: Secondary | ICD-10-CM

## 2023-01-07 DIAGNOSIS — F419 Anxiety disorder, unspecified: Secondary | ICD-10-CM

## 2023-01-08 NOTE — Telephone Encounter (Signed)
Requested by interface surescripts. Last refill 11/28/22 #90 . Too soon for refill.  Requested Prescriptions  Refused Prescriptions Disp Refills   buPROPion (WELLBUTRIN XL) 150 MG 24 hr tablet [Pharmacy Med Name: BUPROPION HCL XL 150 MG TABLET] 90 tablet 0    Sig: TAKE 1 TABLET BY MOUTH EVERY DAY     Psychiatry: Antidepressants - bupropion Passed - 01/07/2023  3:51 PM      Passed - Cr in normal range and within 360 days    Creat  Date Value Ref Range Status  09/12/2022 0.67 0.50 - 0.97 mg/dL Final         Passed - AST in normal range and within 360 days    AST  Date Value Ref Range Status  09/12/2022 17 10 - 30 U/L Final         Passed - ALT in normal range and within 360 days    ALT  Date Value Ref Range Status  09/12/2022 23 6 - 29 U/L Final         Passed - Completed PHQ-2 or PHQ-9 in the last 360 days      Passed - Last BP in normal range    BP Readings from Last 1 Encounters:  12/24/22 125/70         Passed - Valid encounter within last 6 months    Recent Outpatient Visits           1 month ago Moderate episode of recurrent major depressive disorder [facility])   [facility] Della Goo F, FNP   2 months ago Moderate episode of recurrent major depressive disorder [facility])   [facility] Della Goo F, FNP   2 months ago Moderate episode of recurrent major depressive disorder [facility])   [facility] Berniece Salines, FNP   3 months ago Birth control counseling   [facility] Berniece Salines, FNP       Future Appointments             In 1 month Zane Herald, Rudolpho Sevin, FNP [facility], [facility]

## 2023-01-11 ENCOUNTER — Telehealth: Payer: Self-pay | Admitting: Licensed Practical Nurse

## 2023-01-11 NOTE — Telephone Encounter (Signed)
Reached out to pt to reschedule annual appt that was scheduled on 01/14/2023, bc Isabelle Course will not be in office.  Left message for pt to call back to reschedule.

## 2023-01-14 ENCOUNTER — Ambulatory Visit: Payer: BC Managed Care – PPO | Admitting: Licensed Practical Nurse

## 2023-01-14 ENCOUNTER — Encounter: Payer: Self-pay | Admitting: Licensed Practical Nurse

## 2023-01-14 NOTE — Telephone Encounter (Signed)
I sent a MyChart letter concerning the need to get her annual exam rescheduled.

## 2023-01-14 NOTE — Telephone Encounter (Signed)
Reached out to pt (2x) to reschedule annual appt. That was scheduled on 01/14/2023, bc Isabelle Course will not be in office due to illness.  Left message for pt to call back to reschedule.

## 2023-01-24 ENCOUNTER — Other Ambulatory Visit: Payer: Self-pay | Admitting: Nurse Practitioner

## 2023-01-24 ENCOUNTER — Encounter: Payer: Self-pay | Admitting: Nurse Practitioner

## 2023-01-24 DIAGNOSIS — G4709 Other insomnia: Secondary | ICD-10-CM

## 2023-01-25 ENCOUNTER — Ambulatory Visit: Payer: Self-pay | Admitting: *Deleted

## 2023-01-25 ENCOUNTER — Ambulatory Visit
Admission: RE | Admit: 2023-01-25 | Discharge: 2023-01-25 | Disposition: A | Discharge status: Home / Self Care | Payer: BC Managed Care – PPO | Source: Ambulatory Visit

## 2023-01-25 ENCOUNTER — Telehealth: Payer: Self-pay | Admitting: *Deleted

## 2023-01-25 ENCOUNTER — Other Ambulatory Visit: Payer: Self-pay | Admitting: Nurse Practitioner

## 2023-01-25 VITALS — BP 150/96 | HR 75 | Temp 98.9°F | Resp 15 | Ht 63.0 in | Wt 233.0 lb

## 2023-01-25 DIAGNOSIS — R21 Rash and other nonspecific skin eruption: Secondary | ICD-10-CM | POA: Diagnosis not present

## 2023-01-25 DIAGNOSIS — G4709 Other insomnia: Secondary | ICD-10-CM

## 2023-01-25 MED ORDER — DEXAMETHASONE SODIUM PHOSPHATE 10 MG/ML IJ SOLN
10.0000 mg | Freq: Once | INTRAMUSCULAR | Status: AC
  Administered 2023-01-25: 10 mg via INTRAMUSCULAR

## 2023-01-25 MED ORDER — ZOLPIDEM TARTRATE 10 MG PO TABS: 5.0000 mg | ORAL_TABLET | Freq: Every evening | ORAL | 0 refills | Status: DC | PRN

## 2023-01-25 MED ORDER — ZOLPIDEM TARTRATE 5 MG PO TABS
5.0000 mg | ORAL_TABLET | Freq: Every day | ORAL | 1 refills | Status: DC
Start: 2023-01-25 — End: 2023-01-25

## 2023-01-25 MED ORDER — PREDNISONE 10 MG (21) PO TBPK: ORAL_TABLET | Freq: Every day | ORAL | 0 refills | Status: DC

## 2023-01-25 NOTE — Telephone Encounter (Signed)
Requested medication (s) are due for refill today - no  Requested medication (s) are on the active medication list -no  Future visit scheduled -yes  Last refill: 01/25/23  Notes to clinic: change is dosage- non delegated Rx  Requested Prescriptions  Pending Prescriptions Disp Refills   zolpidem (AMBIEN) 5 MG tablet [Pharmacy Med Name: ZOLPIDEM TARTRATE 5 MG TABLET] 30 tablet 1    Sig: TAKE 1 TABLET BY MOUTH AT BEDTIME AS NEEDED FOR SLEEP.     Not Delegated - Psychiatry:  Anxiolytics/Hypnotics Failed - 01/24/2023  3:51 PM      Failed - This refill cannot be delegated      Failed - Urine Drug Screen completed in last 360 days      Passed - Valid encounter within last 6 months    Recent Outpatient Visits           2 months ago Moderate episode of recurrent major depressive disorder [facility])   [facility] Della Goo F, FNP   2 months ago Moderate episode of recurrent major depressive disorder [facility])   [facility] Della Goo F, FNP   3 months ago Moderate episode of recurrent major depressive disorder [facility])   [facility] Berniece Salines, FNP   4 months ago Birth control counseling   [facility] Berniece Salines, FNP       Future Appointments             Today  [facility]    In 3 weeks Berniece Salines, FNP [facility], [facility]               Requested Prescriptions  Pending Prescriptions Disp Refills   zolpidem (AMBIEN) 5 MG tablet [Pharmacy Med Name: ZOLPIDEM TARTRATE 5 MG TABLET] 30 tablet 1    Sig: TAKE 1 TABLET BY MOUTH AT BEDTIME AS NEEDED FOR SLEEP.     Not Delegated - Psychiatry:  Anxiolytics/Hypnotics Failed - 01/24/2023  3:51 PM      Failed - This refill cannot be delegated      Failed - Urine Drug Screen completed in last 360 days      Passed - Valid encounter within last 6 months    Recent Outpatient  Visits           2 months ago Moderate episode of recurrent major depressive disorder [facility])   [facility] Della Goo F, FNP   2 months ago Moderate episode of recurrent major depressive disorder [facility])   [facility] Della Goo F, FNP   3 months ago Moderate episode of recurrent major depressive disorder [facility])   [facility] Berniece Salines, FNP   4 months ago Birth control counseling   [facility] Berniece Salines, FNP       Future Appointments             Today  [facility]    In 3 weeks Zane Herald, Rudolpho Sevin, FNP [facility], [facility]

## 2023-01-25 NOTE — Telephone Encounter (Signed)
Patient was triaged today and sent to UC for treatment- no open appointment-rash.   Patient also wants to make provider aware that her insurance is only covering #15 of Ambien per month- the other she has to pay out of pocket for. Patient would like to know if there is anything else she can do?- per patient maybe get increased dose and half the pill- explained to patient recommended dosing for female is 5mg - she still may get insurance push back. Will send message to provider for advise.

## 2023-01-25 NOTE — Telephone Encounter (Signed)
  Chief Complaint: widespread rash Symptoms: rash on neck, back of knees/legs, unbiblical area- patient states her kids recently had poison oak/ivy- but her rash looks different in some areas.  Frequency: 2 weeks- not going away with home/OTC treatment Pertinent Negatives: Patient denies headache, sore throat, joint pain  Disposition: [] ED /[x] Urgent Care (no appt availability in office) / [] Appointment(In office/virtual)/ []  [facility]/ [] Home Care/ [] Refused Recommended Disposition /[] [facility]/ []  Follow-up with PCP Additional Notes: Call to office- no open appointment- UC advised.  Care advised given for symptoms.

## 2023-01-25 NOTE — ED Provider Notes (Signed)
[facility]    CSN: [id number] Arrival date & time: 01/25/23  1713      History   Chief Complaint Chief Complaint  Patient presents with   Rash    HPI Diane Myers is a 32 y.o. female.   Patient presents for evaluation of a erythematous pruritic rash present for 2 weeks.  Initially began on the right side of the forehead then spread to the neck, behind the bilateral knees then to the bellybutton wrapping around the waist.  Worse to the anterior of the neck.  Children had similar symptoms since they had exposure to poison oak, she denies a direct exposure.  Has attempted use of Benadryl, calamine, hydrocortisone, anti-itch cream, Goldbond and numbing cream which have all been ineffective.  Over the last 4 days has been having intermittent frontal headaches described as mild with associated dizziness.  Denies severe pain but headaches have been noticeable.  All symptoms interfering with sleep and has caused increased fatigue.  Unsure if rash is consistent with poison oak.  Denies presence of drainage or fevers.  Denies changes in toiletries, diet or recent travel.  Has been tolerating food and liquids.  Denies congestion, ear pain drainage fullness.   Past Medical History:  Diagnosis Date   Anxiety    Depression    Family history of breast cancer    Family history of breast cancer     Patient Active Problem List   Diagnosis Date Noted   Moderate episode of recurrent major depressive disorder (HCC) 09/12/2022   Anxiety 09/12/2022   Hidradenitis suppurativa 09/12/2022   Genetic testing 08/19/2018   Family history of breast cancer    At high risk for breast cancer 08/03/2018    Past Surgical History:  Procedure Laterality Date   CHOLECYSTECTOMY     GALLBLADDER SURGERY      OB History   No obstetric history on file.      Home Medications    Prior to Admission medications   Medication Sig Start Date End Date Taking? Authorizing Provider  buPROPion  (WELLBUTRIN XL) 150 MG 24 hr tablet Take 1 tablet (150 mg total) by mouth daily. 11/23/22  Yes Berniece Salines, FNP  cholestyramine Lanetta Inch) 4 g packet Take 1 packet (4 g total) by mouth 4 (four) times daily. 12/17/22  Yes Midge Minium, MD  sertraline (ZOLOFT) 25 MG tablet Take 1 tablet (25 mg total) by mouth daily. 11/23/22  Yes Berniece Salines, FNP  zolpidem (AMBIEN) 10 MG tablet Take 0.5 tablets (5 mg total) by mouth at bedtime as needed for sleep. 01/25/23 02/24/23 Yes Berniece Salines, FNP  loperamide (IMODIUM) 2 MG capsule Take by mouth as needed for diarrhea or loose stools.    [provider]    Family History Family History  Problem Relation Age of Onset   Cancer Mother    Breast cancer Mother 12   Alcohol abuse Father    Hypertension Father    Cancer Maternal Aunt    Breast cancer Maternal Aunt        2 maternal great aunts   Breast cancer Cousin    Breast cancer Other     Social History Social History   Tobacco Use   Smoking status: Former    Current packs/day: 0.00    Types: Cigarettes    Quit date: 06/14/2019    Years since quitting: 3.6   Smokeless tobacco: Never  Vaping Use   Vaping status: Former  Retail buyer  Topics   Alcohol use: Yes    Comment: social   Drug use: No     Allergies   Cephalosporins, Cetirizine, Doxycycline, Erythromycin, Sulfa antibiotics, Amoxicillin, and Clindamycin   Review of Systems Review of Systems  Constitutional: Negative.   HENT: Negative.    Respiratory: Negative.    Cardiovascular: Negative.   Gastrointestinal: Negative.   Musculoskeletal: Negative.   Skin:  Positive for rash. Negative for color change, pallor and wound.  Neurological:  Positive for dizziness and headaches. Negative for tremors, seizures, syncope, facial asymmetry, speech difficulty, weakness, light-headedness and numbness.     Physical Exam Triage Vital Signs ED Triage Vitals  Encounter Vitals Group     BP 01/25/23 1754 (!) 150/96      Systolic BP Percentile --      Diastolic BP Percentile --      Pulse Rate 01/25/23 1754 75     Resp 01/25/23 1754 15     Temp 01/25/23 1754 98.9 F (37.2 C)     Temp Source 01/25/23 1754 Oral     SpO2 01/25/23 1754 98 %     Weight 01/25/23 1752 233 lb 0.4 oz (105.7 kg)     Height 01/25/23 1752 5\' 3"  (1.6 m)     Head Circumference --      Peak Flow --      Pain Score 01/25/23 1752 4     Pain Loc --      Pain Education --      Exclude from Growth Chart --    No data found.  Updated Vital Signs BP (!) 150/96 (BP Location: Right Arm)   Pulse 75   Temp 98.9 F (37.2 C) (Oral)   Resp 15   Ht 5\' 3"  (1.6 m)   Wt 233 lb 0.4 oz (105.7 kg)   LMP 01/03/2023 (Approximate)   SpO2 98%   BMI 41.28 kg/m   Visual Acuity Right Eye Distance:   Left Eye Distance:   Bilateral Distance:    Right Eye Near:   Left Eye Near:    Bilateral Near:     Physical Exam Constitutional:      Appearance: Normal appearance.  HENT:     Head: Normocephalic.  Eyes:     Extraocular Movements: Extraocular movements intact.  Pulmonary:     Effort: Pulmonary effort is normal.  Skin:    Comments: Erythematous maculopapular rash present to the forehead, the anterior and lateral aspects of the neck, the lower abdomen and bilateral flank and in the posterior bilateral knee  Neurological:     General: No focal deficit present.     Mental Status: She is alert and oriented to person, place, and time. Mental status is at baseline.      UC Treatments / Results  Labs (all labs ordered are listed, but only abnormal results are displayed) Labs Reviewed - No data to display  EKG   Radiology No results found.  Procedures Procedures (including critical care time)  Medications Ordered in UC Medications - No data to display  Initial Impression / Assessment and Plan / UC Course  I have reviewed the triage vital signs and the nursing notes.  Pertinent labs & imaging results that were available during  my care of the patient were reviewed by me and considered in my medical decision making (see chart for details).  Rash  Rash consistent with inflammatory process, no signs of infection, discussed with patient, denies direct exposure to poison oak, will move  forward with treatment of symptoms, no direct cause of headaches, could be related to lack of sleep and stress, advised to monitor and to take over-the-counter analgesics for treatment, Decadron injection given in office and prescribed prednisone taper for outpatient use, may use topical medications for management of pruritus and advise against long exposure to heat to prevent further irritation, may follow-up with urgent care if symptoms persist or worsen or rash or headache Final Clinical Impressions(s) / UC Diagnoses   Final diagnoses:  None   Discharge Instructions   None    ED Prescriptions   None    PDMP not reviewed this encounter.   Valinda Hoar, NP 01/25/23 [phone]

## 2023-01-25 NOTE — Telephone Encounter (Signed)
Reason for Disposition  SEVERE itching (i.e., interferes with sleep, normal activities or school)  Answer Assessment - Initial Assessment Questions 1. APPEARANCE of RASH: "Describe the rash." (e.g., spots, blisters, raised areas, skin peeling, scaly)     Red- burns and itches- skin feels thick, red bumps- tiny 2. SIZE: "How big are the spots?" (e.g., tip of pen, eraser, coin; inches, centimeters)     Neck- center to chest, backs of knees- large areas, spots at bellybutton- straight lines.  3. LOCATION: "Where is the rash located?"     Neck and behind knee,abdomin   4. COLOR: "What color is the rash?" (Note: It is difficult to assess rash color in people with darker-colored skin. When this situation occurs, simply ask the caller to describe what they see.)     red 5. ONSET: "When did the rash begin?"     2 weeks ago 6. FEVER: "Do you have a fever?" If Yes, ask: "What is your temperature, how was it measured, and when did it start?"     no 7. ITCHING: "Does the rash itch?" If Yes, ask: "How bad is the itch?" (Scale 1-10; or mild, moderate, severe)     severe 8. CAUSE: "What do you think is causing the rash?"     Unsure- poison oak 9. MEDICINE FACTORS: "Have you started any new medicines within the last 2 weeks?" (e.g., antibiotics)      no 10. OTHER SYMPTOMS: "Do you have any other symptoms?" (e.g., dizziness, headache, sore throat, joint pain)       Dizziness- not sleeping  Protocols used: Rash or Redness - [facility]

## 2023-01-25 NOTE — Telephone Encounter (Signed)
FYI

## 2023-01-25 NOTE — ED Triage Notes (Signed)
Patient reports itchy burning rash on her neck, behind her knees and around the front of her abdomen for the past 2 weeks.  Patient has tried OTC creams and medicine.

## 2023-01-25 NOTE — Discharge Instructions (Signed)
Today you are being treated for rash, based on appearance appears inflammatory as if something is coming contact with your skin, at this time there are no signs of infection  You have been given an injection of steroids today in the office today to help reduce the inflammatory process that occurs with this rash which will help minimize your itching as well as begin to clear  Starting tomorrow take prednisone every morning with food as directed, to continue the above process  You may continue use of topical calamine or Benadryl cream to help manage itching, you may also continue oral Benadryl  Please avoid long exposures to heat such as a hot steamy shower or being outside as this may cause further irritation to your rash  You may follow-up with his urgent care as needed if symptoms persist or worsen

## 2023-01-25 NOTE — Telephone Encounter (Signed)
Spoke to pharmacy they will cancel  Ambien 5. Patient notified

## 2023-02-18 ENCOUNTER — Encounter: Payer: Self-pay | Admitting: Nurse Practitioner

## 2023-02-18 ENCOUNTER — Other Ambulatory Visit: Payer: Self-pay

## 2023-02-18 ENCOUNTER — Ambulatory Visit: Payer: BC Managed Care – PPO | Admitting: Nurse Practitioner

## 2023-02-18 VITALS — BP 120/72 | HR 98 | Temp 98.2°F | Resp 16 | Ht 65.0 in | Wt 232.6 lb

## 2023-02-18 DIAGNOSIS — F331 Major depressive disorder, recurrent, moderate: Secondary | ICD-10-CM

## 2023-02-18 DIAGNOSIS — F419 Anxiety disorder, unspecified: Secondary | ICD-10-CM | POA: Diagnosis not present

## 2023-02-18 DIAGNOSIS — J069 Acute upper respiratory infection, unspecified: Secondary | ICD-10-CM

## 2023-02-18 DIAGNOSIS — G4709 Other insomnia: Secondary | ICD-10-CM | POA: Diagnosis not present

## 2023-02-18 MED ORDER — AZITHROMYCIN 250 MG PO TABS
ORAL_TABLET | ORAL | 0 refills | Status: AC
Start: 2023-02-18 — End: 2023-02-23

## 2023-02-18 MED ORDER — BUPROPION HCL ER (XL) 150 MG PO TB24: 150.0000 mg | ORAL_TABLET | Freq: Every day | ORAL | 1 refills | Status: DC

## 2023-02-18 MED ORDER — ZOLPIDEM TARTRATE 10 MG PO TABS
5.0000 mg | ORAL_TABLET | Freq: Every evening | ORAL | 3 refills | Status: DC | PRN
Start: 2023-02-18 — End: 2023-08-22

## 2023-02-18 MED ORDER — SERTRALINE HCL 25 MG PO TABS
25.0000 mg | ORAL_TABLET | Freq: Every day | ORAL | 1 refills | Status: DC
Start: 2023-02-18 — End: 2023-12-20

## 2023-02-18 NOTE — Assessment & Plan Note (Signed)
Continue zoloft 25 mg daily, wellbutrin 150 mg daily, ambien 5 mg at bedtime

## 2023-02-18 NOTE — Progress Notes (Signed)
BP 120/72   Pulse 98   Temp 98.2 F (36.8 C) (Oral)   Resp 16   Ht 5\' 5"  (1.651 m)   Wt 232 lb 9.6 oz (105.5 kg)   LMP 01/03/2023 (Approximate)   SpO2 99%   BMI 38.71 kg/m    Subjective:    Patient ID: Diane Myers, female    DOB: 08-04-1990, 32 y.o.   MRN: [id number]  HPI: Diane Myers is a 32 y.o. female  Chief Complaint  Patient presents with   Ear Pain   URI    Cough, sneezing, congested for 1 week   Depression   Anxiety   URI Compliant: symptoms started 1 week ago -Fever: no -Cough: yes -Shortness of breath: no -Wheezing: no -Chest congestion: no -Nasal congestion: yes -Runny nose: yes -Post nasal drip: yes -Sore throat: yes -Sinus pressure: yes -Headache: yes -Face pain: yes -Ear pain:  yes- right ear -Ear pressure: yes -Relief with OTC cold/cough medications: she has taken OTC headache medication  She says some relief  Depression/anxiety/insomnia Medication zoloft 25 mg daily, wellbutrin 150 mg daily, ambien 5 mg at bedtime Last appointment discussed that we may need to consider going to psychiatry. , patient reports she is doing much better, will continue with current treatment plan.   Compliant yes Side effects no PHQ9 negative GAD negative Medication zoloft 25 mg daily and wellbutrin 150 mg daily.   Previous medication: prozac, zoloft, cymbalta, lexapro      02/18/2023   11:49 AM 02/18/2023   11:35 AM 11/23/2022    2:20 PM 11/01/2022   11:37 AM 10/17/2022    1:43 PM  Depression screen PHQ 2/9  Decreased Interest 0 0 0 3 2  Down, Depressed, Hopeless 0 0 0 2 1  PHQ - 2 Score 0 0 0 5 3  Altered sleeping 0  3 2 2   Tired, decreased energy 0  0 3 2  Change in appetite 0  0 2 0  Feeling bad or failure about yourself  0  1 2 2   Trouble concentrating 1  0 1 2  Moving slowly or fidgety/restless 0  1 0 0  Suicidal thoughts 0  0 0 0  PHQ-9 Score 1  5 15 11   Difficult doing work/chores Not difficult at all  Not difficult at all Somewhat difficult  Somewhat difficult       02/18/2023   11:50 AM 11/23/2022    2:20 PM 11/01/2022   11:37 AM 10/17/2022    1:43 PM  GAD 7 : Generalized Anxiety Score  Nervous, Anxious, on Edge 0 0 2 3  Control/stop worrying 0 0 2 2  Worry too much - different things 0 1 2 2   Trouble relaxing 0 0 2 2  Restless 0 1 0 2  Easily annoyed or irritable 1 0 0 2  Afraid - awful might happen 0 0 0 2  Total GAD 7 Score 1 2 8 15   Anxiety Difficulty Not difficult at all Not difficult at all Somewhat difficult Somewhat difficult     Relevant past medical, surgical, family and social history reviewed and updated as indicated. Interim medical history since our last visit reviewed. Allergies and medications reviewed and updated.  Review of Systems  Constitutional: Negative for fever or weight change.  HEENT: positive for nasal congestion, sore throat, facial pressure and ear pain Respiratory: Negative for cough and shortness of breath.   Cardiovascular: Negative for chest pain or palpitations.  Gastrointestinal: Negative  for abdominal pain, no bowel changes.  Musculoskeletal: Negative for gait problem or joint swelling.  Skin: Negative for rash.  Neurological: Negative for dizziness, positive for headache.  No other specific complaints in a complete review of systems (except as listed in HPI above).      Objective:    BP 120/72   Pulse 98   Temp 98.2 F (36.8 C) (Oral)   Resp 16   Ht 5\' 5"  (1.651 m)   Wt 232 lb 9.6 oz (105.5 kg)   LMP 01/03/2023 (Approximate)   SpO2 99%   BMI 38.71 kg/m   Wt Readings from Last 3 Encounters:  02/18/23 232 lb 9.6 oz (105.5 kg)  01/25/23 233 lb 0.4 oz (105.7 kg)  12/24/22 233 lb 1.6 oz (105.7 kg)    Physical Exam  Constitutional: Patient appears well-developed and well-nourished. Obese  No distress.  HEENT: head atraumatic, normocephalic, pupils equal and reactive to light, ears TMs clear, neck supple, throat within normal limits, lymphadenopathy, facial  tenderness Cardiovascular: Normal rate, regular rhythm and normal heart sounds.  No murmur heard. No BLE edema. Pulmonary/Chest: Effort normal and breath sounds normal. No respiratory distress. Abdominal: Soft.  There is no tenderness. Psychiatric: Patient has a normal mood and affect. behavior is normal. Judgment and thought content normal.  Results for orders placed or performed in visit on 12/24/22  POCT urine pregnancy  Result Value Ref Range   Preg Test, Ur Negative Negative      Assessment & Plan:   Problem List Items Addressed This Visit       Other   Moderate episode of recurrent major depressive disorder (HCC) - Primary    Continue zoloft 25 mg daily, wellbutrin 150 mg daily, ambien 5 mg at bedtime      Relevant Medications   sertraline (ZOLOFT) 25 MG tablet   buPROPion (WELLBUTRIN XL) 150 MG 24 hr tablet   Anxiety    Continue zoloft 25 mg daily, wellbutrin 150 mg daily, ambien 5 mg at bedtime      Relevant Medications   sertraline (ZOLOFT) 25 MG tablet   buPROPion (WELLBUTRIN XL) 150 MG 24 hr tablet   Other insomnia    Continue zoloft 25 mg daily, wellbutrin 150 mg daily, ambien 5 mg at bedtime      Relevant Medications   zolpidem (AMBIEN) 10 MG tablet   Other Visit Diagnoses     Viral upper respiratory tract infection       sinusitis- treat with zpac, start zyrtec, flonase and mucinex, push fluids and get rest   Relevant Medications   azithromycin (ZITHROMAX) 250 MG tablet   Other Relevant Orders   Novel Coronavirus, NAA ([facility])        Follow up plan: Return in about 6 months (around 08/18/2023) for follow up.

## 2023-02-19 LAB — SPECIMEN STATUS REPORT

## 2023-02-19 LAB — NOVEL CORONAVIRUS, NAA: SARS-CoV-2, NAA: NOT DETECTED

## 2023-02-20 ENCOUNTER — Ambulatory Visit: Payer: BC Managed Care – PPO | Admitting: Nurse Practitioner

## 2023-04-18 ENCOUNTER — Ambulatory Visit: Payer: BC Managed Care – PPO

## 2023-05-10 ENCOUNTER — Encounter: Payer: Self-pay | Admitting: Nurse Practitioner

## 2023-05-13 ENCOUNTER — Other Ambulatory Visit: Payer: Self-pay | Admitting: Nurse Practitioner

## 2023-05-13 DIAGNOSIS — F331 Major depressive disorder, recurrent, moderate: Secondary | ICD-10-CM

## 2023-05-13 DIAGNOSIS — F419 Anxiety disorder, unspecified: Secondary | ICD-10-CM

## 2023-05-13 MED ORDER — BUPROPION HCL ER (XL) 300 MG PO TB24: 300.0000 mg | ORAL_TABLET | Freq: Every day | ORAL | 0 refills | Status: DC

## 2023-07-01 ENCOUNTER — Ambulatory Visit (INDEPENDENT_AMBULATORY_CARE_PROVIDER_SITE_OTHER): Payer: 59 | Admitting: Nurse Practitioner

## 2023-07-01 ENCOUNTER — Other Ambulatory Visit: Payer: Self-pay

## 2023-07-01 VITALS — BP 120/80 | HR 98 | Temp 98.2°F | Resp 16 | Ht 65.0 in | Wt 239.9 lb

## 2023-07-01 DIAGNOSIS — E66812 Obesity, class 2: Secondary | ICD-10-CM

## 2023-07-01 DIAGNOSIS — F419 Anxiety disorder, unspecified: Secondary | ICD-10-CM | POA: Diagnosis not present

## 2023-07-01 DIAGNOSIS — Z6839 Body mass index (BMI) 39.0-39.9, adult: Secondary | ICD-10-CM

## 2023-07-01 DIAGNOSIS — F331 Major depressive disorder, recurrent, moderate: Secondary | ICD-10-CM | POA: Diagnosis not present

## 2023-07-01 MED ORDER — ZEPBOUND 2.5 MG/0.5ML ~~LOC~~ SOAJ
2.5000 mg | SUBCUTANEOUS | 0 refills | Status: DC
Start: 2023-07-01 — End: 2024-01-08

## 2023-07-01 MED ORDER — BUPROPION HCL ER (XL) 300 MG PO TB24: 300.0000 mg | ORAL_TABLET | Freq: Every day | ORAL | 1 refills | Status: DC

## 2023-07-01 NOTE — Progress Notes (Signed)
BP 120/80 (Cuff Size: Large)   Pulse 98   Temp 98.2 F (36.8 C) (Oral)   Resp 16   Ht 5\' 5"  (1.651 m)   Wt 239 lb 14.4 oz (108.8 kg)   SpO2 98%   BMI 39.92 kg/m    Subjective:    Patient ID: Diane Myers, female    DOB: 1991-01-22, 33 y.o.   MRN: [id number]  HPI: Diane Myers is a 33 y.o. female  Chief Complaint  Patient presents with   Depression    Follow up after increase in medication   Obesity    Discuss weight loss options    Discussed the use of AI scribe software for clinical note transcription with the patient, who gave verbal consent to proceed.  History of Present Illness   The patient, with a history of depression and anxiety managed on Zoloft 25mg  daily, Wellbutrin 300mg  daily, and Ambien 10mg  at bedtime, presents today to discuss weight loss options. She reports a stable mood and denies any depressive or anxious symptoms. She expresses frustration with her current weight of 239 pounds and a BMI of 39.92, stating that she has been unable to get under 230 pounds despite her efforts. She is interested in exploring weight loss medications and is open to discussing all options.       07/01/2023   11:06 AM 02/18/2023   11:49 AM 02/18/2023   11:35 AM  Depression screen PHQ 2/9  Decreased Interest 0 0 0  Down, Depressed, Hopeless 0 0 0  PHQ - 2 Score 0 0 0  Altered sleeping 0 0   Tired, decreased energy 0 0   Change in appetite 0 0   Feeling bad or failure about yourself  0 0   Trouble concentrating 0 1   Moving slowly or fidgety/restless 0 0   Suicidal thoughts 0 0   PHQ-9 Score 0 1   Difficult doing work/chores Not difficult at all Not difficult at all        07/01/2023   11:06 AM 02/18/2023   11:50 AM 11/23/2022    2:20 PM 11/01/2022   11:37 AM  GAD 7 : Generalized Anxiety Score  Nervous, Anxious, on Edge 0 0 0 2  Control/stop worrying 0 0 0 2  Worry too much - different things 0 0 1 2  Trouble relaxing 0 0 0 2  Restless 0 0 1 0  Easily annoyed or  irritable 0 1 0 0  Afraid - awful might happen 0 0 0 0  Total GAD 7 Score 0 1 2 8   Anxiety Difficulty Not difficult at all Not difficult at all Not difficult at all Somewhat difficult     Relevant past medical, surgical, family and social history reviewed and updated as indicated. Interim medical history since our last visit reviewed. Allergies and medications reviewed and updated.  Review of Systems  Constitutional: Negative for fever or weight change.  Respiratory: Negative for cough and shortness of breath.   Cardiovascular: Negative for chest pain or palpitations.  Gastrointestinal: Negative for abdominal pain, no bowel changes.  Musculoskeletal: Negative for gait problem or joint swelling.  Skin: Negative for rash.  Neurological: Negative for dizziness or headache.  No other specific complaints in a complete review of systems (except as listed in HPI above).      Objective:    BP 120/80 (Cuff Size: Large)   Pulse 98   Temp 98.2 F (36.8 C) (Oral)   Resp 16  Ht 5\' 5"  (1.651 m)   Wt 239 lb 14.4 oz (108.8 kg)   SpO2 98%   BMI 39.92 kg/m    Wt Readings from Last 3 Encounters:  07/01/23 239 lb 14.4 oz (108.8 kg)  02/18/23 232 lb 9.6 oz (105.5 kg)  01/25/23 233 lb 0.4 oz (105.7 kg)    Physical Exam  Constitutional: Patient appears well-developed and well-nourished. Obese  No distress.  HEENT: head atraumatic, normocephalic, pupils equal and reactive to light, neck supple Cardiovascular: Normal rate, regular rhythm and normal heart sounds.  No murmur heard. No BLE edema. Pulmonary/Chest: Effort normal and breath sounds normal. No respiratory distress. Abdominal: Soft.  There is no tenderness. Psychiatric: Patient has a normal mood and affect. behavior is normal. Judgment and thought content normal.  Results for orders placed or performed in visit on 02/18/23  Novel Coronavirus, NAA ([facility])   Collection Time: 02/18/23 12:00 AM   Specimen: Nasopharyngeal(NP) swabs in  vial transport medium   Nasopharynge  Previous  Result Value Ref Range   SARS-CoV-2, NAA Not Detected Not Detected  Specimen status report   Collection Time: 02/18/23 12:00 AM  Result Value Ref Range   specimen status report Comment        Assessment & Plan:   Problem List Items Addressed This Visit       Other   Moderate episode of recurrent major depressive disorder (HCC) - Primary   Relevant Medications   buPROPion (WELLBUTRIN XL) 300 MG 24 hr tablet   Anxiety   Relevant Medications   buPROPion (WELLBUTRIN XL) 300 MG 24 hr tablet   Other Visit Diagnoses       Class 2 severe obesity due to excess calories with serious comorbidity and body mass index (BMI) of 39.0 to 39.9 in adult [facility])       Relevant Medications   tirzepatide (ZEPBOUND) 2.5 MG/0.5ML Pen   Other Relevant Orders   Hemoglobin A1c   TSH        Assessment and Plan    Obesity comorbidities include depression and anxiety BMI 39.92, patient expressed interest in weight loss medications. Discussed options including Contrave, Zepbound, and [id number]. Contrave not suitable due to current high dose of Wellbutrin. Zepbound and Reginal Lutes are injectables with potential side effects including abdominal pain, constipation, nausea, vomiting, and fatigue. Discussed cost, potential need for long-term use, and the importance of lifestyle modifications alongside medication use. -Order Zepbound, initiate prior authorization process. -Check A1c and thyroid function today. -If Zepbound not approved, consider Wegovy.  Depression/Anxiety Screenings negative, currently managed on Zoloft 25mg  daily and Wellbutrin 300mg  daily. -Continue current regimen. -Refill Wellbutrin prescription.  Insomnia Currently managed on Ambien 10mg  at bedtime. -Continue current regimen.        Follow up plan: Return in about 6 months (around 12/29/2023) for follow up.

## 2023-07-02 ENCOUNTER — Encounter: Payer: Self-pay | Admitting: Nurse Practitioner

## 2023-07-02 LAB — HEMOGLOBIN A1C
Hgb A1c MFr Bld: 4.9 %{Hb} (ref <5.7)
Mean Plasma Glucose: 94 mg/dL
eAG (mmol/L): 5.2 mmol/L

## 2023-07-02 LAB — TSH: TSH: 1.29 m[IU]/L

## 2023-08-21 ENCOUNTER — Other Ambulatory Visit: Payer: Self-pay | Admitting: Nurse Practitioner

## 2023-08-21 ENCOUNTER — Ambulatory Visit: Payer: Self-pay | Admitting: Nurse Practitioner

## 2023-08-21 DIAGNOSIS — G4709 Other insomnia: Secondary | ICD-10-CM

## 2023-08-21 NOTE — Progress Notes (Deleted)
   There were no vitals taken for this visit.   Subjective:    Patient ID: Diane Myers, female    DOB: March 25, 1991, 33 y.o.   MRN: [id number]  HPI: Diane Myers is a 33 y.o. female  No chief complaint on file.   Discussed the use of AI scribe software for clinical note transcription with the patient, who gave verbal consent to proceed.  History of Present Illness           07/01/2023   11:06 AM 02/18/2023   11:49 AM 02/18/2023   11:35 AM  Depression screen PHQ 2/9  Decreased Interest 0 0 0  Down, Depressed, Hopeless 0 0 0  PHQ - 2 Score 0 0 0  Altered sleeping 0 0   Tired, decreased energy 0 0   Change in appetite 0 0   Feeling bad or failure about yourself  0 0   Trouble concentrating 0 1   Moving slowly or fidgety/restless 0 0   Suicidal thoughts 0 0   PHQ-9 Score 0 1   Difficult doing work/chores Not difficult at all Not difficult at all     Relevant past medical, surgical, family and social history reviewed and updated as indicated. Interim medical history since our last visit reviewed. Allergies and medications reviewed and updated.  Review of Systems  Per HPI unless specifically indicated above     Objective:    There were no vitals taken for this visit.  {Vitals History (Optional):23777} Wt Readings from Last 3 Encounters:  07/01/23 239 lb 14.4 oz (108.8 kg)  02/18/23 232 lb 9.6 oz (105.5 kg)  01/25/23 233 lb 0.4 oz (105.7 kg)    Physical Exam  Results for orders placed or performed in visit on 07/01/23  Hemoglobin A1c   Collection Time: 07/01/23 11:27 AM  Result Value Ref Range   Hgb A1c MFr Bld 4.9 <5.7 % of total Hgb   Mean Plasma Glucose 94 mg/dL   eAG (mmol/L) 5.2 mmol/L  TSH   Collection Time: 07/01/23 11:27 AM  Result Value Ref Range   TSH 1.29 mIU/L   {Labs (Optional):23779}    Assessment & Plan:   Problem List Items Addressed This Visit   None    Assessment and Plan             Follow up plan: No follow-ups on  file.

## 2023-08-22 NOTE — Telephone Encounter (Signed)
 Requested medication (s) are due for refill today: Yes  Requested medication (s) are on the active medication list: Yes  Last refill:    Future visit scheduled: Yes  Notes to clinic:  Not delegated.    Requested Prescriptions  Pending Prescriptions Disp Refills   zolpidem (AMBIEN) 10 MG tablet [Pharmacy Med Name: ZOLPIDEM TARTRATE 10 MG TABLET] 30 tablet     Sig: TAKE 1/2 TABLET (5 MG TOTAL) BY MOUTH EVERY DAY AT BEDTIME AS NEEDED FOR SLEEP     Not Delegated - Psychiatry:  Anxiolytics/Hypnotics Failed - 08/22/2023  8:57 AM      Failed - This refill cannot be delegated      Failed - Urine Drug Screen completed in last 360 days      Passed - Valid encounter within last 6 months    Recent Outpatient Visits           1 month ago Moderate episode of recurrent major depressive disorder [facility])   [facility] Della Goo F, FNP   6 months ago Moderate episode of recurrent major depressive disorder [facility])   [facility] Della Goo F, FNP   9 months ago Moderate episode of recurrent major depressive disorder [facility])   [facility] Berniece Salines, FNP   9 months ago Moderate episode of recurrent major depressive disorder [facility])   [facility] Berniece Salines, FNP   10 months ago Moderate episode of recurrent major depressive disorder [facility])   [facility] Berniece Salines, FNP       Future Appointments             In 4 months Zane Herald, Rudolpho Sevin, FNP [facility], [facility]

## 2023-09-09 ENCOUNTER — Ambulatory Visit: Payer: Self-pay

## 2023-10-25 ENCOUNTER — Other Ambulatory Visit: Payer: Self-pay | Admitting: Nurse Practitioner

## 2023-10-25 DIAGNOSIS — G4709 Other insomnia: Secondary | ICD-10-CM

## 2023-10-28 ENCOUNTER — Encounter: Payer: Self-pay | Admitting: Nurse Practitioner

## 2023-10-28 DIAGNOSIS — G4709 Other insomnia: Secondary | ICD-10-CM

## 2023-10-28 MED ORDER — ZOLPIDEM TARTRATE 10 MG PO TABS
10.0000 mg | ORAL_TABLET | Freq: Every evening | ORAL | 1 refills | Status: DC | PRN
Start: 2023-10-28 — End: 2024-05-05

## 2023-10-29 NOTE — Telephone Encounter (Signed)
 Duplicate request, refilled 10/28/23.  Requested Prescriptions  Pending Prescriptions Disp Refills   zolpidem  (AMBIEN ) 10 MG tablet [Pharmacy Med Name: ZOLPIDEM  TARTRATE 10 MG TABLET] 15 tablet 1    Sig: TAKE 1/2 TABLET EVERY DAY AT BEDTIME AS NEEDED FOR SLEEP (INS COVERS 45 TABS/75 DAYS)     Not Delegated - Psychiatry:  Anxiolytics/Hypnotics Failed - 10/29/2023  9:08 AM      Failed - This refill cannot be delegated      Failed - Urine Drug Screen completed in last 360 days      Failed - Valid encounter within last 6 months    Recent Outpatient Visits   None     Future Appointments             In 2 months Abram Hoguet, Monalisa Angles, FNP [facility], [facility]

## 2023-12-18 ENCOUNTER — Other Ambulatory Visit: Payer: Self-pay | Admitting: Nurse Practitioner

## 2023-12-18 DIAGNOSIS — F419 Anxiety disorder, unspecified: Secondary | ICD-10-CM

## 2023-12-18 DIAGNOSIS — F331 Major depressive disorder, recurrent, moderate: Secondary | ICD-10-CM

## 2023-12-20 NOTE — Telephone Encounter (Signed)
 Requested medications are due for refill today.  yes  Requested medications are on the active medications list.  yes  Last refill. 02/18/2023 #90 1 rf  Future visit scheduled.   yes  Notes to clinic.  Labs are expired.    Requested Prescriptions  Pending Prescriptions Disp Refills   sertraline  (ZOLOFT ) 25 MG tablet [Pharmacy Med Name: SERTRALINE  HCL 25 MG TABLET] 90 tablet 1    Sig: Take 1 tablet (25 mg total) by mouth daily.     Psychiatry:  Antidepressants - SSRI - sertraline  Failed - 12/20/2023 12:26 PM      Failed - AST in normal range and within 360 days    AST  Date Value Ref Range Status  09/12/2022 17 10 - 30 U/L Final         Failed - ALT in normal range and within 360 days    ALT  Date Value Ref Range Status  09/12/2022 23 6 - 29 U/L Final         Failed - Valid encounter within last 6 months    Recent Outpatient Visits   None     Future Appointments             In 1 week Gareth Mliss FALCON, FNP [facility]            Passed - Completed PHQ-2 or PHQ-9 in the last 360 days

## 2024-01-01 ENCOUNTER — Ambulatory Visit: Payer: Self-pay | Admitting: Nurse Practitioner

## 2024-01-08 ENCOUNTER — Ambulatory Visit: Payer: Self-pay | Admitting: Nurse Practitioner

## 2024-01-08 VITALS — BP 122/74 | HR 95 | Resp 16 | Ht 65.0 in | Wt 227.0 lb

## 2024-01-08 DIAGNOSIS — L732 Hidradenitis suppurativa: Secondary | ICD-10-CM | POA: Diagnosis not present

## 2024-01-08 DIAGNOSIS — F331 Major depressive disorder, recurrent, moderate: Secondary | ICD-10-CM

## 2024-01-08 DIAGNOSIS — Z131 Encounter for screening for diabetes mellitus: Secondary | ICD-10-CM

## 2024-01-08 DIAGNOSIS — G4709 Other insomnia: Secondary | ICD-10-CM | POA: Diagnosis not present

## 2024-01-08 DIAGNOSIS — Z6839 Body mass index (BMI) 39.0-39.9, adult: Secondary | ICD-10-CM

## 2024-01-08 DIAGNOSIS — F419 Anxiety disorder, unspecified: Secondary | ICD-10-CM

## 2024-01-08 DIAGNOSIS — Z130 Encounter for screening for diseases of the blood and blood-forming organs and certain disorders involving the immune mechanism: Secondary | ICD-10-CM

## 2024-01-08 DIAGNOSIS — Z13220 Encounter for screening for lipoid disorders: Secondary | ICD-10-CM

## 2024-01-08 DIAGNOSIS — E66812 Obesity, class 2: Secondary | ICD-10-CM

## 2024-01-08 MED ORDER — SERTRALINE HCL 25 MG PO TABS: 25.0000 mg | ORAL_TABLET | Freq: Every day | ORAL | 1 refills | Status: AC

## 2024-01-08 MED ORDER — PHENTERMINE HCL 37.5 MG PO TABS: ORAL_TABLET | ORAL | 0 refills | Status: DC

## 2024-01-08 MED ORDER — BUPROPION HCL ER (XL) 300 MG PO TB24: 300.0000 mg | ORAL_TABLET | Freq: Every day | ORAL | 1 refills | Status: AC

## 2024-01-08 NOTE — Progress Notes (Signed)
 BP 122/74   Pulse 95   Resp 16   Ht 5' 5 (1.651 m)   Wt 227 lb (103 kg)   SpO2 96%   BMI 37.77 kg/m    Subjective:    Patient ID: Diane Myers, female    DOB: 1990-07-08, 33 y.o.   MRN: [id number]  HPI: Diane Myers is a 33 y.o. female  Chief Complaint  Patient presents with   Medical Management of Chronic Issues    Insurance covered part of Zepbound , but still too expensive. Would like to discuss other options.    Discussed the use of AI scribe software for clinical note transcription with the patient, who gave verbal consent to proceed.  History of Present Illness Diane Myers is a 33 year old female with obesity, anxiety, depression, and insomnia who presents for a six-month follow-up focused on weight management.  Obesity and weight management - Current weight is 227 pounds, decreased from previous weight of 239 pounds - Current BMI is 37.77, previously 39.92 - Waist circumference is 45 inches - Unable to afford Zipound due to high cost - Focusing on lifestyle modifications including dietary changes and increased physical activity  Mood and sleep disturbances - History of anxiety, depression, and insomnia - Currently managed with Wellbutrin  300 mg daily, sertraline  25 mg daily, and Ambien  10 mg at bedtime as needed - Mental health is stable with negative GAD and PHQ-9 scores  Metabolic and endocrine screening - Previous TSH, A1c, and lipid panel results are within normal limits     Starting weight: 239 lbs Starting BMI: 39.92 Current weight: 227 lbs Current BMI: 37.77 Waist Measurement : 45 inches  - Encourage continuation of lifestyle modifications, including dietary management and regular exercise. -continue to increase physical activity, getting at least 150 min of physical activity a week.  Work on including [employer], broadcasting/film/video 2 days a week.  - continue eating at a calorie deficit 1600-1800 cal a day, eating a well balanced diet with  whole foods, avoiding processed foods.   Patient is motivated to continue working on lifestyle modification.       01/08/2024    9:40 AM 07/01/2023   11:06 AM 02/18/2023   11:49 AM  Depression screen PHQ 2/9  Decreased Interest 0 0 0  Down, Depressed, Hopeless 0 0 0  PHQ - 2 Score 0 0 0  Altered sleeping 0 0 0  Tired, decreased energy 0 0 0  Change in appetite 0 0 0  Feeling bad or failure about yourself  0 0 0  Trouble concentrating 0 0 1  Moving slowly or fidgety/restless 0 0 0  Suicidal thoughts 0 0 0  PHQ-9 Score 0 0 1  Difficult doing work/chores  Not difficult at all Not difficult at all       01/08/2024    9:40 AM 07/01/2023   11:06 AM 02/18/2023   11:50 AM 11/23/2022    2:20 PM  GAD 7 : Generalized Anxiety Score  Nervous, Anxious, on Edge 0 0 0 0  Control/stop worrying 0 0 0 0  Worry too much - different things 0 0 0 1  Trouble relaxing 0 0 0 0  Restless 0 0 0 1  Easily annoyed or irritable 0 0 1 0  Afraid - awful might happen 0 0 0 0  Total GAD 7 Score 0 0 1 2  Anxiety Difficulty  Not difficult at all Not difficult at all Not difficult at all  Relevant past medical, surgical, family and social history reviewed and updated as indicated. Interim medical history since our last visit reviewed. Allergies and medications reviewed and updated.  Review of Systems  Constitutional: Negative for fever or weight change.  Respiratory: Negative for cough and shortness of breath.   Cardiovascular: Negative for chest pain or palpitations.  Gastrointestinal: Negative for abdominal pain, no bowel changes.  Musculoskeletal: Negative for gait problem or joint swelling.  Skin: Negative for rash.  Neurological: Negative for dizziness or headache.  No other specific complaints in a complete review of systems (except as listed in HPI above).      Objective:     BP 122/74   Pulse 95   Resp 16   Ht 5' 5 (1.651 m)   Wt 227 lb (103 kg)   SpO2 96%   BMI 37.77 kg/m    Wt  Readings from Last 3 Encounters:  01/08/24 227 lb (103 kg)  07/01/23 239 lb 14.4 oz (108.8 kg)  02/18/23 232 lb 9.6 oz (105.5 kg)    Physical Exam Physical Exam MEASUREMENTS: Weight- 227, BMI- 37.77. GENERAL: Alert, cooperative, well developed, no acute distress HEENT: Normocephalic, normal oropharynx, moist mucous membranes CHEST: Clear to auscultation bilaterally, no wheezes, rhonchi, or crackles CARDIOVASCULAR: Normal heart rate and rhythm, S1 and S2 normal without murmurs ABDOMEN: Soft, non-tender, non-distended, without organomegaly, normal bowel sounds EXTREMITIES: No cyanosis or edema NEUROLOGICAL: Cranial nerves grossly intact, moves all extremities without gross motor or sensory deficit   Results for orders placed or performed in visit on 07/01/23  Hemoglobin A1c   Collection Time: 07/01/23 11:27 AM  Result Value Ref Range   Hgb A1c MFr Bld 4.9 <5.7 % of total Hgb   Mean Plasma Glucose 94 mg/dL   eAG (mmol/L) 5.2 mmol/L  TSH   Collection Time: 07/01/23 11:27 AM  Result Value Ref Range   TSH 1.29 mIU/L          Assessment & Plan:   Problem List Items Addressed This Visit       Musculoskeletal and Integument   Hidradenitis suppurativa     Other   Moderate episode of recurrent major depressive disorder (HCC)   Relevant Medications   sertraline  (ZOLOFT ) 25 MG tablet   buPROPion  (WELLBUTRIN  XL) 300 MG 24 hr tablet   Anxiety - Primary   Relevant Medications   sertraline  (ZOLOFT ) 25 MG tablet   buPROPion  (WELLBUTRIN  XL) 300 MG 24 hr tablet   Other insomnia   Class 2 severe obesity due to excess calories with serious comorbidity and body mass index (BMI) of 39.0 to 39.9 in adult [facility])   Relevant Medications   phentermine  (ADIPEX-P ) 37.5 MG tablet   Other Visit Diagnoses       Screening for deficiency anemia         Screening for cholesterol level         Screening for diabetes mellitus            Assessment and Plan Assessment & Plan Obesity Obesity  with a current weight of 227 pounds, BMI of 37.77, and waist circumference of 45 inches. Previous weight was 239 pounds with a BMI of 39.92. She has been implementing lifestyle modifications including diet and exercise. Zipound was too expensive. Discussed alternative weight loss medications including Wegovy and phentermine . Phentermine  is a cost-effective option, typically $9-$15 per month, but may cause side effects such as palpitations, headaches, dizziness, dry mouth, and increased blood pressure. It is a controlled substance  and can be used long-term if tolerated. Discussed the importance of hydration to mitigate side effects and monitoring blood pressure. Outcome statistics indicate a potential weight loss of 10-15 pounds with lifestyle modifications and phentermine . Medical decision making included considering Wegovy and Contrave, but phentermine  was chosen due to cost and current medication regimen. - Prescribe phentermine , starting with half a tablet for the first few days. - Advise to take phentermine  early in the day to avoid insomnia. - Instruct to drink plenty of water to prevent dry mouth and other side effects. - Monitor blood pressure regularly while on phentermine . - Schedule follow-up appointment in three months to assess progress and side effects. - Continue lifestyle modifications with a goal of 1600-1800 calories per day and at least 150 minutes of physical activity per week, including strength training 2-3 days a week.  Healthy Weight Loss Guide ?? Weight Loss Goal - Aim for 1-2 pounds per week - Target: 5-10% of your starting body weight over 3-6 months ??? Nutrition Tips - Eat 3 meals per day and avoid skipping meals - Fill half your plate with vegetables, a quarter with protein, a quarter with whole grains - Choose lean proteins: chicken, fish, eggs, tofu, beans - Limit: - Sugary drinks (soda, sweet tea, juice) - Fried foods and fast food - Processed snacks (chips, candy,  cookies) - Drink at least 64 oz of water per day - Practice portion control and mindful eating ???? Lifestyle Habits - Track what you eat (apps like MyFitnessPal, Lose It!, or a paper log) - Get 7-9 hours of sleep per night - Manage stress (meditation, breathing exercises, counseling if needed) - Limit alcohol (empty calories and may increase hunger) ???? Exercise Recommendations - Goal: 150 minutes per week of moderate activity (e.g., brisk walking, cycling) - Start with 10-15 minutes/day and build up gradually - Add 2 days per week of strength training (light weights, resistance bands, or bodyweight) ?? Remember: Progress > Perfection Small changes every day add up. Don't give up! - Avoid high-fat or greasy foods to reduce nausea - Focus on protein at each meal to preserve muscle mass - Stay well hydrated (at least 64 oz water per day) - Limit sugar and processed carbohydrates ?? Managing Side Effects if on weight loss medication - Eat slowly and stop eating when you feel full - Use anti-nausea strategies: ginger tea, peppermint, crackers - Talk to your provider about adjusting the dose if needed - Stool softeners or fiber supplements can help with constipation ?? Staying on Track - Track weight and non-scale victories (energy, clothing fit, labs) - Follow up with your provider regularly - Don't stop medication without medical guidance - Combine medication with healthy habits for best results ?? Remember Weight loss medications are a tool, not a shortcut. Healthy habits matter. Be patient and consistent--small changes lead to big results.    Depression/anxiety -continue wellbutrin  and zoloft  -stable  HS -no changes    Follow up plan: Return in about 3 months (around 04/09/2024) for follow up.

## 2024-03-12 ENCOUNTER — Ambulatory Visit
Admission: RE | Admit: 2024-03-12 | Discharge: 2024-03-12 | Disposition: A | Discharge status: Home / Self Care | Payer: Self-pay | Source: Ambulatory Visit | Attending: Physician Assistant | Admitting: Physician Assistant

## 2024-03-12 ENCOUNTER — Ambulatory Visit (INDEPENDENT_AMBULATORY_CARE_PROVIDER_SITE_OTHER)

## 2024-03-12 ENCOUNTER — Ambulatory Visit: Payer: Self-pay | Admitting: Physician Assistant

## 2024-03-12 VITALS — BP 118/80 | HR 108 | Temp 99.0°F | Resp 18

## 2024-03-12 DIAGNOSIS — M1711 Unilateral primary osteoarthritis, right knee: Secondary | ICD-10-CM | POA: Diagnosis not present

## 2024-03-12 DIAGNOSIS — M25561 Pain in right knee: Secondary | ICD-10-CM

## 2024-03-12 MED ORDER — PREDNISONE 10 MG PO TABS: ORAL_TABLET | ORAL | 0 refills | Status: DC

## 2024-03-12 MED ORDER — KETOROLAC TROMETHAMINE 60 MG/2ML IM SOLN
30.0000 mg | Freq: Once | INTRAMUSCULAR | Status: AC
  Administered 2024-03-12: 30 mg via INTRAMUSCULAR

## 2024-03-12 NOTE — Discharge Instructions (Addendum)
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 - You can continue Tylenol  for pain relief.  I would also recommend applying topical Voltaren gel which you can purchase over-the-counter.  Ice and elevate knee.  Avoid painful activities. - Try to lose a little weight as this takes pressure off the knee and can keep things from getting significantly worse. -I did send a corticosteroid taper to the pharmacy. - Follow-up with orthopedics.  You may benefit from knee joint injections.  You have a condition requiring you to follow up with Orthopedics so please call one of the following office for appointment:   [facility] Address: [address] [place], [place] [address] Phone: [phone]  [facility] [address] [place], [place] [address] Phone: [phone]  [facility] [address] [place], [place] [address] Phone: [phone]

## 2024-03-12 NOTE — ED Provider Notes (Signed)
 [facility]    CSN: [id number] Arrival date & time: 03/12/24  0935      History   Chief Complaint Chief Complaint  Patient presents with   Knee Pain    Knee has been hurting for about a week. The pain has been worse for the past 2 days. I am unable to put much pressure on it and it feels very tight. - Entered by patient    HPI LEE-ANN GAL is a 33 y.o. female presenting for 2 week history of progressively worsening right knee pain. Pain is worse in the lateral knee joint. Increased pain with weight bearing. Patient denies injury. She says symptoms worsened after she squatted down to get something and then stood back up. She reports crepitus of both knees but says she does not normally have pain in her knees. No obvious swelling. Denies any significant instability and has not had any falls. She has been taking Aleve  and Tylenol  without relief.  HPI  Past Medical History:  Diagnosis Date   Anxiety    Depression    Family history of breast cancer    Family history of breast cancer     Patient Active Problem List   Diagnosis Date Noted   Class 2 severe obesity due to excess calories with serious comorbidity and body mass index (BMI) of 39.0 to 39.9 in adult 01/08/2024   Other insomnia 02/18/2023   Moderate episode of recurrent major depressive disorder (HCC) 09/12/2022   Anxiety 09/12/2022   Hidradenitis suppurativa 09/12/2022   Genetic testing 08/19/2018   Family history of breast cancer    At high risk for breast cancer 08/03/2018    Past Surgical History:  Procedure Laterality Date   CHOLECYSTECTOMY     GALLBLADDER SURGERY      OB History   No obstetric history on file.      Home Medications    Prior to Admission medications   Medication Sig Start Date End Date Taking? Authorizing Provider  predniSONE  (DELTASONE ) 10 MG tablet Take 6 tabs p.o. on day 1 and decrease by 1 tablet daily until complete 03/12/24  Yes Arvis Jolan KATHEE, PA-C  buPROPion   (WELLBUTRIN  XL) 300 MG 24 hr tablet Take 1 tablet (300 mg total) by mouth daily. 01/08/24   Pender, Julie F, FNP  loperamide (IMODIUM) 2 MG capsule Take by mouth as needed for diarrhea or loose stools.    [provider]  phentermine  (ADIPEX-P ) 37.5 MG tablet One tab by mouth qAM 01/08/24   Pender, Julie F, FNP  sertraline  (ZOLOFT ) 25 MG tablet Take 1 tablet (25 mg total) by mouth daily. 01/08/24   Pender, Julie F, FNP  zolpidem  (AMBIEN ) 10 MG tablet Take 1 tablet (10 mg total) by mouth at bedtime as needed for sleep. 10/28/23   Gareth Mliss FALCON, FNP    Family History Family History  Problem Relation Age of Onset   Cancer Mother    Breast cancer Mother 17   Alcohol abuse Father    Hypertension Father    Cancer Maternal Aunt    Breast cancer Maternal Aunt        2 maternal great aunts   Breast cancer Cousin    Breast cancer Other     Social History Social History   Tobacco Use   Smoking status: Former    Current packs/day: 0.00    Types: Cigarettes    Quit date: 06/14/2019    Years since quitting: 4.7   Smokeless tobacco:  Never  Vaping Use   Vaping status: Former  Substance Use Topics   Alcohol use: Yes    Comment: social   Drug use: No     Allergies   Cephalosporins, Cetirizine, Doxycycline , Erythromycin , Sulfa  antibiotics, Amoxicillin, and Clindamycin   Review of Systems Review of Systems  Musculoskeletal:  Positive for arthralgias. Negative for gait problem and joint swelling.  Skin:  Negative for color change and wound.  Neurological:  Negative for weakness and numbness.     Physical Exam Triage Vital Signs ED Triage Vitals  Encounter Vitals Group     BP 03/12/24 0950 118/80     Girls Systolic BP Percentile --      Girls Diastolic BP Percentile --      Boys Systolic BP Percentile --      Boys Diastolic BP Percentile --      Pulse Rate 03/12/24 0950 (!) 108     Resp 03/12/24 0950 18     Temp 03/12/24 0950 99 F (37.2 C)     Temp Source 03/12/24  0950 Oral     SpO2 03/12/24 0950 98 %     Weight --      Height --      Head Circumference --      Peak Flow --      Pain Score 03/12/24 0949 6     Pain Loc --      Pain Education --      Exclude from Growth Chart --    No data found.  Updated Vital Signs BP 118/80 (BP Location: Right Arm)   Pulse (!) 108   Temp 99 F (37.2 C) (Oral)   Resp 18   LMP 03/09/2024   SpO2 98%      Physical Exam Vitals and nursing note reviewed.  Constitutional:      General: She is not in acute distress.    Appearance: Normal appearance. She is not ill-appearing or toxic-appearing.  HENT:     Head: Normocephalic and atraumatic.  Eyes:     General: No scleral icterus.       Right eye: No discharge.        Left eye: No discharge.     Conjunctiva/sclera: Conjunctivae normal.  Cardiovascular:     Rate and Rhythm: Tachycardia present.     Pulses: Normal pulses.  Pulmonary:     Effort: Pulmonary effort is normal. No respiratory distress.  Musculoskeletal:     Cervical back: Neck supple.     Right knee: No swelling. Decreased range of motion (limited to 90 degrees flexion). Tenderness present over the lateral joint line. Normal pulse.  Skin:    General: Skin is dry.  Neurological:     General: No focal deficit present.     Mental Status: She is alert. Mental status is at baseline.     Motor: No weakness.     Gait: Gait normal.  Psychiatric:        Mood and Affect: Mood normal.        Behavior: Behavior normal.      UC Treatments / Results  Labs (all labs ordered are listed, but only abnormal results are displayed) Labs Reviewed - No data to display  EKG   Radiology DG Knee Complete 4 Views Right Result Date: 03/12/2024 CLINICAL DATA:  Right knee pain after injury 2 weeks ago. EXAM: RIGHT KNEE - COMPLETE 4+ VIEW COMPARISON:  None Available. FINDINGS: No evidence of fracture, dislocation, or joint effusion. No  evidence of arthropathy or other focal bone abnormality. Soft tissues  are unremarkable. IMPRESSION: Negative. Electronically Signed   By: Lynwood Landy Raddle M.D.   On: 03/12/2024 10:48    Procedures Procedures (including critical care time)  Medications Ordered in UC Medications  ketorolac  (TORADOL ) injection 30 mg (30 mg Intramuscular Given 03/12/24 1031)    Initial Impression / Assessment and Plan / UC Course  I have reviewed the triage vital signs and the nursing notes.  Pertinent labs & imaging results that were available during my care of the patient were reviewed by me and considered in my medical decision making (see chart for details).   33 y/o female presents for atraumatic right knee pain x 2 weeks.  Xray of right knee obtained. Wet read shows narrowed joint space, worse in the lateral joint.   Degenerative knee joint/acute pain. Patient given 30 mg IM ketorolac . Sent prednisone  taper. Advised Tylenol , Voltaren gel, cryotherapy. Advised ortho follow up.   Overread negative. No change to treatment plan.    Final Clinical Impressions(s) / UC Diagnoses   Final diagnoses:  Acute pain of right knee  Osteoarthritis of right knee, unspecified osteoarthritis type     Discharge Instructions      - The x-ray shows arthritis in your knee, especially the lateral joint. - You can continue Tylenol  for pain relief.  I would also recommend applying topical Voltaren gel which you can purchase over-the-counter.  Ice and elevate knee.  Avoid painful activities. - Try to lose a little weight as this takes pressure off the knee and can keep things from getting significantly worse. -I did send a corticosteroid taper to the pharmacy. - Follow-up with orthopedics.  You may benefit from knee joint injections.  You have a condition requiring you to follow up with Orthopedics so please call one of the following office for appointment:   [facility] Address: [address] [place], [place] [address] Phone: [phone]  [facility] [address],  [place], [place] [address] Phone: [phone]  [facility] [address], [place], [place] [address] Phone: [phone]      ED Prescriptions     Medication Sig Dispense Auth. Provider   predniSONE  (DELTASONE ) 10 MG tablet Take 6 tabs p.o. on day 1 and decrease by 1 tablet daily until complete 21 tablet Arvis Jolan NOVAK, PA-C      PDMP not reviewed this encounter.   Arvis Jolan NOVAK, PA-C 03/12/24 1101

## 2024-03-12 NOTE — ED Triage Notes (Signed)
 Pt present right knee pain, symptoms started two weeks. Pt state hurts to bear weight on her knee.

## 2024-04-06 ENCOUNTER — Other Ambulatory Visit: Payer: Self-pay | Admitting: Nurse Practitioner

## 2024-04-06 DIAGNOSIS — E66812 Obesity, class 2: Secondary | ICD-10-CM

## 2024-04-06 NOTE — Telephone Encounter (Unsigned)
 Copied from CRM [id number]. Topic: Clinical - Medication Refill >> Apr 06, 2024  9:22 AM Charlet HERO wrote: Medication: phentermine  (ADIPEX-P ) 37.5 MG tablet  Has the patient contacted their pharmacy? No No refill she is stating that anxiety is good no issues  This is the patient's preferred pharmacy:  [id number] - [place], [place] - [address] [place] [address] Phone: [phone] Fax: [phone]  Is this the correct pharmacy for this prescription? Yes If no, delete pharmacy and type the correct one.   Has the prescription been filled recently? Yes  Is the patient out of the medication? Yes  Has the patient been seen for an appointment in the last year OR does the patient have an upcoming appointment? Yes  Can we respond through MyChart? Yes  Agent: Please be advised that Rx refills may take up to 3 business days. We ask that you follow-up with your pharmacy.

## 2024-04-07 NOTE — Telephone Encounter (Signed)
 Requested medication (s) are due for refill today: na   Requested medication (s) are on the active medication list: yes   Last refill:  01/08/24 #90 0 refills  Future visit scheduled: yes 04/20/24  Notes to clinic:  not delegated per protocol. Do you want to refill Rx?     Requested Prescriptions  Pending Prescriptions Disp Refills   phentermine  (ADIPEX-P ) 37.5 MG tablet 90 tablet 0    Sig: One tab by mouth qAM     Not Delegated - Neurology: Anticonvulsants - Controlled - phentermine  hydrochloride Failed - 04/07/2024  2:44 PM      Failed - This refill cannot be delegated      Failed - eGFR in normal range and within 360 days    GFR calc Af Amer  Date Value Ref Range Status  09/03/2018 >60 >60 mL/min Final   GFR, Estimated  Date Value Ref Range Status  06/13/2022 >60 >60 mL/min Final    Comment:    (NOTE) Calculated using the CKD-EPI Creatinine Equation (2021)    eGFR  Date Value Ref Range Status  09/12/2022 119 > OR = 60 mL/min/1.53m2 Final         Failed - Cr in normal range and within 360 days    Creat  Date Value Ref Range Status  09/12/2022 0.67 0.50 - 0.97 mg/dL Final         Passed - Last BP in normal range    BP Readings from Last 1 Encounters:  03/12/24 118/80         Passed - Valid encounter within last 6 months    Recent Outpatient Visits           3 months ago Anxiety   [facility] Gareth Mliss FALCON, FNP              Passed - Weight completed in the last 3 months    Wt Readings from Last 1 Encounters:  01/08/24 227 lb (103 kg)

## 2024-04-08 NOTE — Telephone Encounter (Signed)
 Requested medication (s) are due for refill today -provider review   Requested medication (s) are on the active medication list -yes  Future visit scheduled -yes, 04/20/24  Last refill: 01/08/24 #90  Notes to clinic: non delegated Rx, duplicate request  Requested Prescriptions  Pending Prescriptions Disp Refills   phentermine  (ADIPEX-P ) 37.5 MG tablet [Pharmacy Med Name: PHENTERMINE  37.5 MG TABLET] 90 tablet 0    Sig: TAKE 1 TABLET BY MOUTH IN THE MORNING     Not Delegated - Neurology: Anticonvulsants - Controlled - phentermine  hydrochloride Failed - 04/08/2024  9:51 AM      Failed - This refill cannot be delegated      Failed - eGFR in normal range and within 360 days    GFR calc Af Amer  Date Value Ref Range Status  09/03/2018 >60 >60 mL/min Final   GFR, Estimated  Date Value Ref Range Status  06/13/2022 >60 >60 mL/min Final    Comment:    (NOTE) Calculated using the CKD-EPI Creatinine Equation (2021)    eGFR  Date Value Ref Range Status  09/12/2022 119 > OR = 60 mL/min/1.66m2 Final         Failed - Cr in normal range and within 360 days    Creat  Date Value Ref Range Status  09/12/2022 0.67 0.50 - 0.97 mg/dL Final         Failed - Weight completed in the last 3 months    Wt Readings from Last 1 Encounters:  01/08/24 227 lb (103 kg)         Passed - Last BP in normal range    BP Readings from Last 1 Encounters:  03/12/24 118/80         Passed - Valid encounter within last 6 months    Recent Outpatient Visits           3 months ago Anxiety   [facility] Gareth Mliss FALCON, FNP                 Requested Prescriptions  Pending Prescriptions Disp Refills   phentermine  (ADIPEX-P ) 37.5 MG tablet [Pharmacy Med Name: PHENTERMINE  37.5 MG TABLET] 90 tablet 0    Sig: TAKE 1 TABLET BY MOUTH IN THE MORNING     Not Delegated - Neurology: Anticonvulsants - Controlled - phentermine  hydrochloride Failed - 04/08/2024  9:51 AM       Failed - This refill cannot be delegated      Failed - eGFR in normal range and within 360 days    GFR calc Af Amer  Date Value Ref Range Status  09/03/2018 >60 >60 mL/min Final   GFR, Estimated  Date Value Ref Range Status  06/13/2022 >60 >60 mL/min Final    Comment:    (NOTE) Calculated using the CKD-EPI Creatinine Equation (2021)    eGFR  Date Value Ref Range Status  09/12/2022 119 > OR = 60 mL/min/1.31m2 Final         Failed - Cr in normal range and within 360 days    Creat  Date Value Ref Range Status  09/12/2022 0.67 0.50 - 0.97 mg/dL Final         Failed - Weight completed in the last 3 months    Wt Readings from Last 1 Encounters:  01/08/24 227 lb (103 kg)         Passed - Last BP in normal range    BP Readings from Last 1 Encounters:  03/12/24  118/80         Passed - Valid encounter within last 6 months    Recent Outpatient Visits           3 months ago Anxiety   [facility] Gareth Mliss FALCON, [place]

## 2024-04-10 ENCOUNTER — Ambulatory Visit: Admitting: Nurse Practitioner

## 2024-04-20 ENCOUNTER — Ambulatory Visit: Admitting: Nurse Practitioner

## 2024-04-20 ENCOUNTER — Encounter: Payer: Self-pay | Admitting: Nurse Practitioner

## 2024-04-20 VITALS — BP 118/72 | HR 98 | Temp 98.2°F | Resp 16 | Ht 65.0 in | Wt 221.4 lb

## 2024-04-20 DIAGNOSIS — F331 Major depressive disorder, recurrent, moderate: Secondary | ICD-10-CM | POA: Diagnosis not present

## 2024-04-20 DIAGNOSIS — F419 Anxiety disorder, unspecified: Secondary | ICD-10-CM

## 2024-04-20 DIAGNOSIS — M25561 Pain in right knee: Secondary | ICD-10-CM

## 2024-04-20 DIAGNOSIS — E66812 Obesity, class 2: Secondary | ICD-10-CM | POA: Diagnosis not present

## 2024-04-20 DIAGNOSIS — Z6839 Body mass index (BMI) 39.0-39.9, adult: Secondary | ICD-10-CM

## 2024-04-20 MED ORDER — PHENTERMINE HCL 37.5 MG PO TABS: ORAL_TABLET | ORAL | 0 refills | Status: AC

## 2024-04-20 NOTE — Progress Notes (Signed)
 BP 118/72   Pulse 98   Temp 98.2 F (36.8 C)   Resp 16   Ht 5' 5 (1.651 m)   Wt 221 lb 6.4 oz (100.4 kg)   LMP 03/25/2024   SpO2 96%   BMI 36.84 kg/m    Subjective:    Patient ID: Diane Myers, female    DOB: Jan 19, 1991, 33 y.o.   MRN: [id number]  HPI: Diane Myers is a 33 y.o. female  Chief Complaint  Patient presents with   Medical Management of Chronic Issues   Medication Refill   Obesity   Discussed the use of AI scribe software for clinical note transcription with the patient, who gave verbal consent to proceed.  History of Present Illness Diane Myers is a 33 year old female who presents for a three month follow-up regarding obesity management.  Obesity management - Currently taking phentermine  37.5 mg daily for weight loss; has been out of medication for approximately two weeks - Weight today is 221 pounds, decreased from 227 pounds at previous visit - BMI is 36.84 - Waist circumference decreased from 45 to 44 inches - Practicing lifestyle modifications, including maintaining a calorie deficit - Increased appetite and 'food noise' during lapse in phentermine  use - Weight was lower two weeks ago despite medication lapse, which is frustrating  Right knee pain - Experienced severe right knee pain approximately one month ago, preventing ambulation - Urgent care evaluation indicated absence of cartilage in right knee - Received prednisone , which provided partial relief - Currently using ibuprofen  800 mg as needed and Voltaren gel for pain management - Knee pain impacts ability to work, especially with frequent squatting required for job  Mood stability - Mood remains stable - Satisfied with current psychiatric medications: Wellbutrin  and Zoloft  - No current need for refills, but may require them soon    Wt Readings from Last 3 Encounters:  04/20/24 221 lb 6.4 oz (100.4 kg)  01/08/24 227 lb (103 kg)  07/01/23 239 lb 14.4 oz (108.8 kg)    Body mass index is 36.84 kg/m.  Flowsheet Row Office Visit from 04/20/2024 in [facility] Office Visit from 01/08/2024 in [facility]  1 44 inches 45 inches        04/20/2024    2:36 PM 01/08/2024    9:40 AM 07/01/2023   11:06 AM  Depression screen PHQ 2/9  Decreased Interest 0 0 0  Down, Depressed, Hopeless 0 0 0  PHQ - 2 Score 0 0 0  Altered sleeping 1 0 0  Tired, decreased energy 0 0 0  Change in appetite 0 0 0  Feeling bad or failure about yourself  0 0 0  Trouble concentrating 0 0 0  Moving slowly or fidgety/restless 0 0 0  Suicidal thoughts 0 0 0  PHQ-9 Score 1 0  0   Difficult doing work/chores Not difficult at all  Not difficult at all     Data saved with a previous flowsheet row definition       04/20/2024    2:36 PM 01/08/2024    9:40 AM 07/01/2023   11:06 AM 02/18/2023   11:50 AM  GAD 7 : Generalized Anxiety Score  Nervous, Anxious, on Edge 0 0 0 0  Control/stop worrying 0 0 0 0  Worry too much - different things 0 0 0 0  Trouble relaxing 0 0 0 0  Restless 0 0 0 0  Easily annoyed  or irritable 0 0 0 1  Afraid - awful might happen 0 0 0 0  Total GAD 7 Score 0 0 0 1  Anxiety Difficulty Not difficult at all  Not difficult at all Not difficult at all     Relevant past medical, surgical, family and social history reviewed and updated as indicated. Interim medical history since our last visit reviewed. Allergies and medications reviewed and updated.  Review of Systems  Constitutional: Negative for fever or weight change.  Respiratory: Negative for cough and shortness of breath.   Cardiovascular: Negative for chest pain or palpitations.  Gastrointestinal: Negative for abdominal pain, no bowel changes.  Musculoskeletal: Negative for gait problem or joint swelling.  Skin: Negative for rash.  Neurological: Negative for dizziness or headache.  No other specific complaints in a complete review of systems (except as  listed in HPI above).      Objective:      BP 118/72   Pulse 98   Temp 98.2 F (36.8 C)   Resp 16   Ht 5' 5 (1.651 m)   Wt 221 lb 6.4 oz (100.4 kg)   LMP 03/25/2024   SpO2 96%   BMI 36.84 kg/m    Wt Readings from Last 3 Encounters:  04/20/24 221 lb 6.4 oz (100.4 kg)  01/08/24 227 lb (103 kg)  07/01/23 239 lb 14.4 oz (108.8 kg)    Physical Exam MEASUREMENTS: Weight- 221, BMI- 36.84. GENERAL: Alert, cooperative, well developed, no acute distress HEENT: Normocephalic, normal oropharynx, moist mucous membranes CHEST: Clear to auscultation bilaterally, No wheezes, rhonchi, or crackles CARDIOVASCULAR: Normal heart rate and rhythm, S1 and S2 normal without murmurs ABDOMEN: Soft, non-tender, non-distended, without organomegaly, Normal bowel sounds EXTREMITIES: No cyanosis or edema NEUROLOGICAL: Cranial nerves grossly intact, Moves all extremities without gross motor or sensory deficit  Results for orders placed or performed in visit on 07/01/23  Hemoglobin A1c   Collection Time: 07/01/23 11:27 AM  Result Value Ref Range   Hgb A1c MFr Bld 4.9 <5.7 % of total Hgb   Mean Plasma Glucose 94 mg/dL   eAG (mmol/L) 5.2 mmol/L  TSH   Collection Time: 07/01/23 11:27 AM  Result Value Ref Range   TSH 1.29 mIU/L          Assessment & Plan:   Problem List Items Addressed This Visit       Other   Moderate episode of recurrent major depressive disorder (HCC)   Anxiety   Class 2 severe obesity due to excess calories with serious comorbidity and body mass index (BMI) of 39.0 to 39.9 in adult   Relevant Medications   phentermine  (ADIPEX-P ) 37.5 MG tablet   Other Visit Diagnoses       Acute pain of right knee    -  Primary   Relevant Orders   Ambulatory referral to Orthopedic Surgery        Assessment and Plan Assessment & Plan Obesity BMI of 36.84. Weight decreased from 227 pounds to 221 pounds. Waist circumference reduced from 45 inches to 44 inches. Phentermine  aids  in appetite suppression and weight management. Two-week lapse in phentermine  led to increased appetite. - Continue phentermine  37.5 mg daily for weight loss - Continue lifestyle modification including calorie deficit - Encourage continuation of lifestyle modifications, including dietary management and regular exercise. -continue to increase physical activity, getting at least 150 min of physical activity a week.  Work on including runner, broadcasting/film/video 2 days a week.  - continue eating at  a calorie deficit 1600-1700 cal a day, eating a well balanced diet with whole foods, avoiding processed foods.   Patient is motivated to continue working on lifestyle modification.    Right knee pain Chronic right knee pain with cartilage deficiency. Pain exacerbated by frequent squatting at work. Previously treated with prednisone , providing some relief. Currently using ibuprofen  800 mg as needed and Voltaren gel for pain management. - Referred to orthopedics for further evaluation and management - Continue ibuprofen  800 mg as needed for pain - Continue using Voltaren gel for pain management  Anxiety disorder depression Anxiety and depression well-managed with Wellbutrin  and Zoloft . No current need for medication refills. - Continue Wellbutrin  and Zoloft  as prescribed      Follow up plan: Return in 3 months (on 07/21/2024) for cpe w/ pap, follow up.

## 2024-05-03 ENCOUNTER — Other Ambulatory Visit: Payer: Self-pay | Admitting: Nurse Practitioner

## 2024-05-03 DIAGNOSIS — G4709 Other insomnia: Secondary | ICD-10-CM

## 2024-05-05 NOTE — Telephone Encounter (Signed)
 Requested medication (s) are due for refill today: yes  Requested medication (s) are on the active medication list: yes  Last refill:  10/28/23  Future visit scheduled: {Yes  Notes to clinic:  Unable to refill per protocol, cannot delegate.      Requested Prescriptions  Pending Prescriptions Disp Refills   zolpidem  (AMBIEN ) 10 MG tablet [Pharmacy Med Name: ZOLPIDEM  TARTRATE 10 MG TABLET] 90 tablet     Sig: TAKE 1 TABLET BY MOUTH AT BEDTIME AS NEEDED FOR SLEEP.     Not Delegated - Psychiatry:  Anxiolytics/Hypnotics Failed - 05/05/2024  9:25 AM      Failed - This refill cannot be delegated      Failed - Urine Drug Screen completed in last 360 days      Passed - Valid encounter within last 6 months    Recent Outpatient Visits           2 weeks ago Acute pain of right knee   [facility] Gareth Mliss FALCON, FNP   3 months ago Anxiety   [facility] Gareth Mliss FALCON, [place]

## 2024-07-22 ENCOUNTER — Encounter: Admitting: Nurse Practitioner
